# Patient Record
Sex: Female | Born: 1950 | Race: White | Hispanic: No | Marital: Married | Smoking: Never smoker
Health system: Southern US, Community
[De-identification: ages and names within clinical notes are randomized; demographics above are authoritative.]

## PROBLEM LIST (undated history)

## (undated) DIAGNOSIS — I10 Essential (primary) hypertension: Secondary | ICD-10-CM

## (undated) DIAGNOSIS — N390 Urinary tract infection, site not specified: Secondary | ICD-10-CM

## (undated) DIAGNOSIS — K5792 Diverticulitis of intestine, part unspecified, without perforation or abscess without bleeding: Secondary | ICD-10-CM

## (undated) DIAGNOSIS — H269 Unspecified cataract: Secondary | ICD-10-CM

## (undated) DIAGNOSIS — E119 Type 2 diabetes mellitus without complications: Secondary | ICD-10-CM

## (undated) DIAGNOSIS — J189 Pneumonia, unspecified organism: Secondary | ICD-10-CM

## (undated) DIAGNOSIS — E039 Hypothyroidism, unspecified: Secondary | ICD-10-CM

## (undated) DIAGNOSIS — E063 Autoimmune thyroiditis: Secondary | ICD-10-CM

## (undated) DIAGNOSIS — T7840XA Allergy, unspecified, initial encounter: Secondary | ICD-10-CM

## (undated) DIAGNOSIS — E785 Hyperlipidemia, unspecified: Secondary | ICD-10-CM

## (undated) DIAGNOSIS — N200 Calculus of kidney: Secondary | ICD-10-CM

## (undated) DIAGNOSIS — M1990 Unspecified osteoarthritis, unspecified site: Secondary | ICD-10-CM

## (undated) DIAGNOSIS — E079 Disorder of thyroid, unspecified: Secondary | ICD-10-CM

## (undated) DIAGNOSIS — F329 Major depressive disorder, single episode, unspecified: Secondary | ICD-10-CM

## (undated) DIAGNOSIS — F32A Depression, unspecified: Secondary | ICD-10-CM

## (undated) HISTORY — PX: COLOSTOMY: SHX63

## (undated) HISTORY — PX: COLOSTOMY CLOSURE: SHX1381

## (undated) HISTORY — DX: Allergy, unspecified, initial encounter: T78.40XA

## (undated) HISTORY — DX: Urinary tract infection, site not specified: N39.0

## (undated) HISTORY — PX: CARPAL TUNNEL RELEASE: SHX101

## (undated) HISTORY — PX: CHOLECYSTECTOMY OPEN: SUR202

## (undated) HISTORY — PX: COLON SURGERY: SHX602

## (undated) HISTORY — DX: Diverticulitis of intestine, part unspecified, without perforation or abscess without bleeding: K57.92

## (undated) HISTORY — DX: Type 2 diabetes mellitus without complications: E11.9

## (undated) HISTORY — PX: GYNECOLOGIC CRYOSURGERY: SHX857

## (undated) HISTORY — PX: COLONOSCOPY: SHX174

## (undated) HISTORY — PX: OTHER SURGICAL HISTORY: SHX169

## (undated) HISTORY — PX: POLYPECTOMY: SHX149

## (undated) HISTORY — PX: CYST EXCISION: SHX5701

## (undated) HISTORY — DX: Unspecified cataract: H26.9

---

## 1977-05-29 HISTORY — PX: TUBAL LIGATION: SHX77

## 2012-09-27 ENCOUNTER — Other Ambulatory Visit: Payer: Self-pay | Admitting: Family Medicine

## 2012-09-27 DIAGNOSIS — Z780 Asymptomatic menopausal state: Secondary | ICD-10-CM

## 2012-09-27 DIAGNOSIS — Z1231 Encounter for screening mammogram for malignant neoplasm of breast: Secondary | ICD-10-CM

## 2012-11-05 ENCOUNTER — Ambulatory Visit
Admission: RE | Admit: 2012-11-05 | Discharge: 2012-11-05 | Disposition: A | Discharge status: Home / Self Care | Payer: BC Managed Care – PPO | Source: Ambulatory Visit | Attending: Family Medicine | Admitting: Family Medicine

## 2012-11-05 DIAGNOSIS — Z780 Asymptomatic menopausal state: Secondary | ICD-10-CM

## 2012-11-05 DIAGNOSIS — Z1231 Encounter for screening mammogram for malignant neoplasm of breast: Secondary | ICD-10-CM

## 2013-04-10 ENCOUNTER — Emergency Department (HOSPITAL_COMMUNITY): Payer: BC Managed Care – PPO

## 2013-04-10 ENCOUNTER — Emergency Department (HOSPITAL_COMMUNITY)
Admission: EM | Admit: 2013-04-10 | Discharge: 2013-04-10 | Disposition: A | Discharge status: Home / Self Care | Payer: BC Managed Care – PPO | Attending: Emergency Medicine | Admitting: Emergency Medicine

## 2013-04-10 ENCOUNTER — Encounter (HOSPITAL_COMMUNITY): Payer: Self-pay | Admitting: Emergency Medicine

## 2013-04-10 DIAGNOSIS — Z87442 Personal history of urinary calculi: Secondary | ICD-10-CM | POA: Insufficient documentation

## 2013-04-10 DIAGNOSIS — N390 Urinary tract infection, site not specified: Secondary | ICD-10-CM

## 2013-04-10 DIAGNOSIS — E063 Autoimmune thyroiditis: Secondary | ICD-10-CM | POA: Insufficient documentation

## 2013-04-10 DIAGNOSIS — E079 Disorder of thyroid, unspecified: Secondary | ICD-10-CM | POA: Insufficient documentation

## 2013-04-10 DIAGNOSIS — Z880 Allergy status to penicillin: Secondary | ICD-10-CM | POA: Insufficient documentation

## 2013-04-10 DIAGNOSIS — I10 Essential (primary) hypertension: Secondary | ICD-10-CM | POA: Insufficient documentation

## 2013-04-10 DIAGNOSIS — Z881 Allergy status to other antibiotic agents status: Secondary | ICD-10-CM | POA: Insufficient documentation

## 2013-04-10 HISTORY — DX: Essential (primary) hypertension: I10

## 2013-04-10 HISTORY — DX: Disorder of thyroid, unspecified: E07.9

## 2013-04-10 HISTORY — DX: Autoimmune thyroiditis: E06.3

## 2013-04-10 LAB — POCT I-STAT, CHEM 8
Chloride: 101 mEq/L (ref 96–112)
Creatinine, Ser: 0.9 mg/dL (ref 0.50–1.10)
HCT: 43 % (ref 36.0–46.0)
Potassium: 3.7 mEq/L (ref 3.5–5.1)
Sodium: 139 mEq/L (ref 135–145)

## 2013-04-10 LAB — URINALYSIS, ROUTINE W REFLEX MICROSCOPIC
Glucose, UA: NEGATIVE mg/dL
Protein, ur: 100 mg/dL — AB
pH: 5.5 (ref 5.0–8.0)

## 2013-04-10 LAB — URINE MICROSCOPIC-ADD ON

## 2013-04-10 MED ORDER — CIPROFLOXACIN HCL 500 MG PO TABS: 500.0000 mg | ORAL_TABLET | Freq: Two times a day (BID) | ORAL | Status: DC

## 2013-04-10 MED ORDER — HYDROMORPHONE HCL PF 1 MG/ML IJ SOLN
1.0000 mg | Freq: Once | INTRAMUSCULAR | Status: AC
  Administered 2013-04-10: 1 mg via INTRAVENOUS
  Filled 2013-04-10: qty 1

## 2013-04-10 MED ORDER — FENTANYL CITRATE 0.05 MG/ML IJ SOLN
50.0000 ug | Freq: Once | INTRAMUSCULAR | Status: AC
  Administered 2013-04-10: 50 ug via INTRAVENOUS
  Filled 2013-04-10: qty 2

## 2013-04-10 MED ORDER — ONDANSETRON HCL 4 MG/2ML IJ SOLN
4.0000 mg | Freq: Once | INTRAMUSCULAR | Status: AC
  Administered 2013-04-10: 4 mg via INTRAVENOUS
  Filled 2013-04-10: qty 2

## 2013-04-10 MED ORDER — CIPROFLOXACIN IN D5W 400 MG/200ML IV SOLN
400.0000 mg | Freq: Once | INTRAVENOUS | Status: AC
  Administered 2013-04-10: 400 mg via INTRAVENOUS
  Filled 2013-04-10: qty 200

## 2013-04-10 MED ORDER — HYDROCODONE-ACETAMINOPHEN 5-325 MG PO TABS: 2.0000 | ORAL_TABLET | ORAL | Status: DC | PRN

## 2013-04-10 MED ORDER — PROMETHAZINE HCL 25 MG PO TABS: 25.0000 mg | ORAL_TABLET | Freq: Four times a day (QID) | ORAL | Status: DC | PRN

## 2013-04-10 NOTE — ED Provider Notes (Signed)
CSN: [id number]     Arrival date & time 04/10/13  0251 History   First MD Initiated Contact with Patient 04/10/13 [phone]     Chief Complaint  Patient presents with  . Abdominal Pain   (Consider location/radiation/quality/duration/timing/severity/associated sxs/prior Treatment) HPI History provided by patient. History of kidney stones and this morning woke at 1:30 AM with sudden onset sharp and severe right flank pain. Pain radiates to right groin. No hematuria noticed. No fevers or chills. No nausea vomiting. Feels similar to previous kidney stones. She has had some dysuria and urinary frequency with symptoms tonight. No known alleviating factors. Past Medical History  Diagnosis Date  . Renal disorder   . Hypertension   . Thyroid disease   . Hashimoto's disease    History reviewed. No pertinent past surgical history. History reviewed. No pertinent family history. History  Substance Use Topics  . Smoking status: Never Smoker   . Smokeless tobacco: Not on file  . Alcohol Use: No   OB History   Grav Para Term Preterm Abortions TAB SAB Ect Mult Living                 Review of Systems  Constitutional: Negative for fever and chills.  Eyes: Negative for pain.  Respiratory: Negative for shortness of breath.   Cardiovascular: Negative for chest pain.  Gastrointestinal: Positive for abdominal pain. Negative for vomiting.  Genitourinary: Positive for flank pain. Negative for dysuria.  Musculoskeletal: Negative for back pain, neck pain and neck stiffness.  Skin: Negative for rash.  Neurological: Negative for headaches.  All other systems reviewed and are negative.    Allergies  Erythromycin and Penicillins  Home Medications  No current outpatient prescriptions on file. BP 126/63  Pulse 73  Temp(Src) 98.5 F (36.9 C) (Oral)  Resp 18  Wt 201 lb 12.8 oz (91.536 kg)  SpO2 98% Physical Exam  Constitutional: She is oriented to person, place, and time. She appears well-developed  and well-nourished.  HENT:  Head: Normocephalic and atraumatic.  Eyes: EOM are normal. Pupils are equal, round, and reactive to light.  Neck: Neck supple.  Cardiovascular: Normal rate, regular rhythm and intact distal pulses.   Pulmonary/Chest: Effort normal and breath sounds normal. No respiratory distress. She exhibits no tenderness.  Abdominal: Soft. Bowel sounds are normal. She exhibits no distension. There is no tenderness. There is no rebound and no guarding.  No CVA tenderness. Localizes symptoms to right flank and right lower quadrant without tenderness  Musculoskeletal: Normal range of motion. She exhibits no edema.  Neurological: She is alert and oriented to person, place, and time.  Skin: Skin is warm and dry.      ED Course  Procedures (including critical care time) Labs Review Labs Reviewed  URINALYSIS, ROUTINE W REFLEX MICROSCOPIC - Abnormal; Notable for the following:    Color, Urine AMBER (*)    APPearance TURBID (*)    Hgb urine dipstick LARGE (*)    Protein, ur 100 (*)    Nitrite POSITIVE (*)    Leukocytes, UA LARGE (*)    All other components within normal limits  URINE MICROSCOPIC-ADD ON - Abnormal; Notable for the following:    Bacteria, UA MANY (*)    All other components within normal limits  URINE CULTURE   Imaging Review Ct Abdomen Pelvis Wo Contrast  04/10/2013   CLINICAL DATA:  Right flank pain radiating to the groin, dysuria.  EXAM: CT ABDOMEN AND PELVIS WITHOUT CONTRAST  TECHNIQUE: Multidetector CT imaging  of the abdomen and pelvis was performed following the standard protocol without intravenous contrast.  COMPARISON:  None available for comparison at time of study interpretation.  FINDINGS: Included view of the lung bases demonstrate minimal right middle lobe atelectasis. Minimal pericardial effusion.  Kidneys are orthotopic, demonstrating normal size and morphology without nephrolithiasis, hydronephrosis; limited assessment for renal masses on this  nonenhanced examination. The unopacified ureters are normal in course and caliber. Mild inflammatory changes about the right ureter without urolithiasis. Urinary bladder is partially distended with mild surrounding fat stranding.  The liver, spleen, pancreas and adrenal glands are unremarkable for this non-contrast examination. Gallbladder appears surgically absent.  The stomach, small and large bowel are normal in course and caliber without inflammatory changes, the sensitivity may be decreased by lack of enteric contrast. Right lower quadrant small bowel anastomotic line, with scarring along the right anterior abdominal wall which suggests takedown. Normal appendix. Sigmoid diverticulosis. No intraperitoneal free fluid nor free air.  Great vessels are normal in course and caliber. No lymphadenopathy by CT size criteria. Internal reproductive organs are unremarkable. The soft tissues and included osseous structures are nonsuspicious. Laparotomy scar.  IMPRESSION: Inflammatory changes about the right ureter and the urinary bladder (cystitis) suggesting infectious process without urolithiasis or obstructive uropathy.  Small bowel anastomotic line, no bowel obstruction.  Minimal pericardial effusion.   Electronically Signed   By: Awilda Metro   On: 04/10/2013 05:40    IV fluids. IV Dilaudid. IV Zofran. IV Cipro  UTI without fevers or vomiting. Vital signs reassuring. Plan discharge home with close outpatient followup. Urine culture pending. Reliable historian and agrees to strict return precautions. Prescription for antibiotics, antiemetics as needed and pain medications as needed.   MDM  Diagnoses: UTI, history of kidney stones  Urinalysis reviewed as above and IV antibiotics provided Improved with IV fluids and IV narcotics Vital signs nursing notes reviewed and considered    Sunnie Nielsen, MD 04/10/13 [phone]

## 2013-04-10 NOTE — ED Notes (Signed)
Pt believes that she passed a kidney stone last night, and that she is currently passing another. Pt reports a hx of kidney stones.

## 2013-04-10 NOTE — ED Notes (Signed)
Presents with right flank pain that began at 1:30 am today and moves to the right lower groin area, pain is described as sharp and pulsating. Reports frequent urination and dysuria. Denies hematuria. Pain feels same as kidney stone previously.

## 2013-04-10 NOTE — ED Notes (Signed)
Opitz, MD at bedside. 

## 2013-04-10 NOTE — ED Notes (Signed)
Care transferred, report received Willene Hatchet, RN.

## 2013-04-12 LAB — URINE CULTURE

## 2013-04-13 ENCOUNTER — Telehealth (HOSPITAL_COMMUNITY): Payer: Self-pay | Admitting: Emergency Medicine

## 2013-04-13 NOTE — ED Notes (Signed)
Post ED Visit - Positive Culture Follow-up  Culture report reviewed by antimicrobial stewardship pharmacist: []  Wes Dulaney, Pharm.D., BCPS []  Celedonio Miyamoto, Pharm.D., BCPS []  Georgina Pillion, Pharm.D., BCPS []  [place], [address].D., BCPS, AAHIVP []  Estella Husk, Pharm.D., BCPS, AAHIVP [x]  Okey Regal, Pharm.D., BCPS  Positive urine culture Treated with Cipro, organism sensitive to the same and no further patient follow-up is required at this time.  Kylie A Holland 04/13/2013, 1:27 PM

## 2013-11-30 ENCOUNTER — Encounter (HOSPITAL_COMMUNITY): Payer: Self-pay | Admitting: Emergency Medicine

## 2013-11-30 ENCOUNTER — Emergency Department (HOSPITAL_COMMUNITY)
Admission: EM | Admit: 2013-11-30 | Discharge: 2013-11-30 | Disposition: A | Discharge status: Home / Self Care | Payer: BC Managed Care – PPO | Source: Home / Self Care | Attending: Family Medicine | Admitting: Family Medicine

## 2013-11-30 DIAGNOSIS — H6091 Unspecified otitis externa, right ear: Secondary | ICD-10-CM

## 2013-11-30 DIAGNOSIS — H60399 Other infective otitis externa, unspecified ear: Secondary | ICD-10-CM

## 2013-11-30 MED ORDER — NEOMYCIN-POLYMYXIN-HC 3.5-10000-1 OT SUSP: 4.0000 [drp] | Freq: Four times a day (QID) | OTIC | Status: DC

## 2013-11-30 NOTE — ED Notes (Signed)
Pt  Reports  r  Earache   As  Well  As  Being  Dizzy      X  sev  Days

## 2013-11-30 NOTE — ED Provider Notes (Signed)
CSN: [id number]     Arrival date & time 11/30/13  1213 History   First MD Initiated Contact with Patient 11/30/13 1217     Chief Complaint  Patient presents with  . Otalgia   (Consider location/radiation/quality/duration/timing/severity/associated sxs/prior Treatment) Patient is a 63 y.o. female presenting with ear pain. The history is provided by the patient.  Otalgia Location:  Right Behind ear:  No abnormality Quality:  Sharp Severity:  Mild Onset quality:  Gradual Duration:  2 days Progression:  Worsening Chronicity:  New Context comment:  Swimming 2 weeks ago in pool. Associated symptoms: no ear discharge, no fever and no rhinorrhea     Past Medical History  Diagnosis Date  . Renal disorder   . Hypertension   . Thyroid disease   . Hashimoto's disease    History reviewed. No pertinent past surgical history. History reviewed. No pertinent family history. History  Substance Use Topics  . Smoking status: Never Smoker   . Smokeless tobacco: Not on file  . Alcohol Use: No   OB History   Grav Para Term Preterm Abortions TAB SAB Ect Mult Living                 Review of Systems  Constitutional: Negative.  Negative for fever.  HENT: Positive for ear pain. Negative for ear discharge, facial swelling, postnasal drip and rhinorrhea.     Allergies  Erythromycin and Penicillins  Home Medications   Prior to Admission medications   Medication Sig Start Date End Date Taking? Authorizing Provider  busPIRone (BUSPAR) 10 MG tablet Take 10 mg by mouth 3 (three) times daily as needed (depression).    Historical Provider, MD  Calcium Carb-Cholecalciferol (CALCIUM 600 + D PO) Take 1 tablet by mouth 2 (two) times daily.    Historical Provider, MD  cetirizine (ZYRTEC) 10 MG tablet Take 10 mg by mouth daily.    Historical Provider, MD  Cholecalciferol (VITAMIN D3) 2000 UNITS TABS Take 1 tablet by mouth daily.    Historical Provider, MD  CINNAMON PO Take 2 capsules by mouth daily.     Historical Provider, MD  ciprofloxacin (CIPRO) 500 MG tablet Take 1 tablet (500 mg total) by mouth 2 (two) times daily. 04/10/13   Teressa Lower, MD  citalopram (CELEXA) 20 MG tablet Take 20 mg by mouth daily.    Historical Provider, MD  Garlic 0347 MG CAPS Take 1 capsule by mouth daily.    Historical Provider, MD  glipiZIDE (GLUCOTROL) 10 MG tablet Take 10 mg by mouth daily before breakfast.    Historical Provider, MD  HYDROcodone-acetaminophen (NORCO/VICODIN) 5-325 MG per tablet Take 2 tablets by mouth every 4 (four) hours as needed. 04/10/13   Teressa Lower, MD  levothyroxine (SYNTHROID, LEVOTHROID) 100 MCG tablet Take 100 mcg by mouth daily before breakfast.    Historical Provider, MD  lisinopril (PRINIVIL,ZESTRIL) 20 MG tablet Take 20 mg by mouth daily.    Historical Provider, MD  lovastatin (MEVACOR) 40 MG tablet Take 40 mg by mouth at bedtime.    Historical Provider, MD  Magnesium Oxide (MAG-OX 400 PO) Take 1 tablet by mouth daily.    Historical Provider, MD  metFORMIN (GLUCOPHAGE) 1000 MG tablet Take 1,000 mg by mouth 2 (two) times daily with a meal.    Historical Provider, MD  mupirocin ointment (BACTROBAN) 2 % Place 1 application into the nose 3 (three) times daily.    Historical Provider, MD  neomycin-polymyxin-hydrocortisone (CORTISPORIN) 3.5-10000-1 otic suspension Place 4 drops into the  right ear 4 (four) times daily. 11/30/13   Billy Fischer, MD  promethazine (PHENERGAN) 25 MG tablet Take 1 tablet (25 mg total) by mouth every 6 (six) hours as needed for nausea or vomiting. 04/10/13   Teressa Lower, MD  vitamin B-12 (CYANOCOBALAMIN) 1000 MCG tablet Take 1,000 mcg by mouth daily.    Historical Provider, MD  vitamin E 400 UNIT capsule Take 400 Units by mouth daily.    Historical Provider, MD   BP 142/98  Pulse 68  Temp(Src) 98 F (36.7 C) (Oral)  Resp 16  SpO2 98% Physical Exam  Nursing note and vitals reviewed. Constitutional: She is oriented to person, place, and time. She appears  well-developed and well-nourished. No distress.  HENT:  Head: Normocephalic.  Right Ear: There is swelling and tenderness. No drainage. Tympanic membrane is not injected, not scarred, not perforated, not erythematous and not bulging.  Left Ear: Tympanic membrane, external ear and ear canal normal.  Nose: Nose normal.  Mouth/Throat: Oropharynx is clear and moist.  Neck: Normal range of motion. Neck supple.  Neurological: She is alert and oriented to person, place, and time.  Skin: Skin is warm and dry.    ED Course  Procedures (including critical care time) Labs Review Labs Reviewed - No data to display  Imaging Review No results found.   MDM   1. Otitis externa, acute, right        Billy Fischer, MD 11/30/13 [phone]

## 2014-02-17 ENCOUNTER — Other Ambulatory Visit: Payer: Self-pay | Admitting: Nurse Practitioner

## 2014-02-17 DIAGNOSIS — Z1231 Encounter for screening mammogram for malignant neoplasm of breast: Secondary | ICD-10-CM

## 2015-06-16 ENCOUNTER — Encounter (HOSPITAL_COMMUNITY): Payer: Self-pay | Admitting: *Deleted

## 2015-06-16 ENCOUNTER — Emergency Department (HOSPITAL_COMMUNITY): Payer: Medicare HMO

## 2015-06-16 ENCOUNTER — Other Ambulatory Visit: Payer: Self-pay

## 2015-06-16 DIAGNOSIS — Z8249 Family history of ischemic heart disease and other diseases of the circulatory system: Secondary | ICD-10-CM | POA: Diagnosis not present

## 2015-06-16 DIAGNOSIS — E785 Hyperlipidemia, unspecified: Secondary | ICD-10-CM | POA: Diagnosis not present

## 2015-06-16 DIAGNOSIS — Z7984 Long term (current) use of oral hypoglycemic drugs: Secondary | ICD-10-CM | POA: Insufficient documentation

## 2015-06-16 DIAGNOSIS — Z79899 Other long term (current) drug therapy: Secondary | ICD-10-CM | POA: Diagnosis not present

## 2015-06-16 DIAGNOSIS — E119 Type 2 diabetes mellitus without complications: Secondary | ICD-10-CM | POA: Insufficient documentation

## 2015-06-16 DIAGNOSIS — E063 Autoimmune thyroiditis: Secondary | ICD-10-CM | POA: Insufficient documentation

## 2015-06-16 DIAGNOSIS — E039 Hypothyroidism, unspecified: Secondary | ICD-10-CM | POA: Insufficient documentation

## 2015-06-16 DIAGNOSIS — R079 Chest pain, unspecified: Principal | ICD-10-CM | POA: Insufficient documentation

## 2015-06-16 DIAGNOSIS — I10 Essential (primary) hypertension: Secondary | ICD-10-CM | POA: Diagnosis not present

## 2015-06-16 DIAGNOSIS — F329 Major depressive disorder, single episode, unspecified: Secondary | ICD-10-CM | POA: Diagnosis not present

## 2015-06-16 LAB — CBC WITH DIFFERENTIAL/PLATELET
BASOS ABS: 0 10*3/uL (ref 0.0–0.1)
Basophils Relative: 1 %
EOS ABS: 0.4 10*3/uL (ref 0.0–0.7)
EOS PCT: 6 %
HCT: 37.1 % (ref 36.0–46.0)
Hemoglobin: 12.6 g/dL (ref 12.0–15.0)
LYMPHS PCT: 42 %
Lymphs Abs: 3 10*3/uL (ref 0.7–4.0)
MCH: 29.5 pg (ref 26.0–34.0)
MCHC: 34 g/dL (ref 30.0–36.0)
MCV: 86.9 fL (ref 78.0–100.0)
MONO ABS: 0.5 10*3/uL (ref 0.1–1.0)
Monocytes Relative: 8 %
Neutro Abs: 3.1 10*3/uL (ref 1.7–7.7)
Neutrophils Relative %: 43 %
PLATELETS: 245 10*3/uL (ref 150–400)
RBC: 4.27 MIL/uL (ref 3.87–5.11)
RDW: 13.2 % (ref 11.5–15.5)
WBC: 7.1 10*3/uL (ref 4.0–10.5)

## 2015-06-16 LAB — BASIC METABOLIC PANEL
ANION GAP: 12 (ref 5–15)
BUN: 8 mg/dL (ref 6–20)
CO2: 26 mmol/L (ref 22–32)
Calcium: 9.5 mg/dL (ref 8.9–10.3)
Chloride: 100 mmol/L — ABNORMAL LOW (ref 101–111)
Creatinine, Ser: 1.08 mg/dL — ABNORMAL HIGH (ref 0.44–1.00)
GFR calc Af Amer: >60 mL/min (ref >60)
GFR, EST NON AFRICAN AMERICAN: 53 mL/min — AB (ref >60)
Glucose, Bld: 208 mg/dL — ABNORMAL HIGH (ref 65–99)
Potassium: 3.9 mmol/L (ref 3.5–5.1)
Sodium: 138 mmol/L (ref 135–145)

## 2015-06-16 LAB — I-STAT TROPONIN, ED: TROPONIN I, POC: 0 ng/mL (ref 0.00–0.08)

## 2015-06-16 NOTE — ED Notes (Signed)
Patient presents about 9pm she started with central chest pain that traveled to her neck and to her back.  +SOB

## 2015-06-17 ENCOUNTER — Observation Stay (HOSPITAL_COMMUNITY)
Admission: EM | Admit: 2015-06-17 | Discharge: 2015-06-18 | Disposition: A | Discharge status: Home / Self Care | Payer: Medicare HMO | Attending: Internal Medicine | Admitting: Internal Medicine

## 2015-06-17 ENCOUNTER — Encounter (HOSPITAL_COMMUNITY): Payer: Self-pay | Admitting: Family Medicine

## 2015-06-17 DIAGNOSIS — I10 Essential (primary) hypertension: Secondary | ICD-10-CM | POA: Diagnosis not present

## 2015-06-17 DIAGNOSIS — E119 Type 2 diabetes mellitus without complications: Secondary | ICD-10-CM

## 2015-06-17 DIAGNOSIS — E063 Autoimmune thyroiditis: Secondary | ICD-10-CM | POA: Diagnosis not present

## 2015-06-17 DIAGNOSIS — E038 Other specified hypothyroidism: Secondary | ICD-10-CM | POA: Diagnosis present

## 2015-06-17 DIAGNOSIS — F329 Major depressive disorder, single episode, unspecified: Secondary | ICD-10-CM | POA: Diagnosis present

## 2015-06-17 DIAGNOSIS — R079 Chest pain, unspecified: Secondary | ICD-10-CM | POA: Diagnosis present

## 2015-06-17 DIAGNOSIS — R0789 Other chest pain: Secondary | ICD-10-CM

## 2015-06-17 DIAGNOSIS — F32A Depression, unspecified: Secondary | ICD-10-CM | POA: Diagnosis present

## 2015-06-17 HISTORY — DX: Calculus of kidney: N20.0

## 2015-06-17 HISTORY — DX: Hyperlipidemia, unspecified: E78.5

## 2015-06-17 HISTORY — DX: Depression, unspecified: F32.A

## 2015-06-17 HISTORY — DX: Major depressive disorder, single episode, unspecified: F32.9

## 2015-06-17 HISTORY — DX: Hypothyroidism, unspecified: E03.9

## 2015-06-17 HISTORY — DX: Pneumonia, unspecified organism: J18.9

## 2015-06-17 HISTORY — DX: Type 2 diabetes mellitus without complications: E11.9

## 2015-06-17 HISTORY — DX: Unspecified osteoarthritis, unspecified site: M19.90

## 2015-06-17 LAB — CBC
HEMATOCRIT: 35 % — AB (ref 36.0–46.0)
HEMOGLOBIN: 12 g/dL (ref 12.0–15.0)
MCH: 29.6 pg (ref 26.0–34.0)
MCHC: 34.3 g/dL (ref 30.0–36.0)
MCV: 86.2 fL (ref 78.0–100.0)
Platelets: 207 10*3/uL (ref 150–400)
RBC: 4.06 MIL/uL (ref 3.87–5.11)
RDW: 13.1 % (ref 11.5–15.5)
WBC: 7 10*3/uL (ref 4.0–10.5)

## 2015-06-17 LAB — TROPONIN I

## 2015-06-17 LAB — CREATININE, SERUM
CREATININE: 0.97 mg/dL (ref 0.44–1.00)
GFR, EST NON AFRICAN AMERICAN: 60 mL/min — AB (ref >60)

## 2015-06-17 MED ORDER — ACETAMINOPHEN 325 MG PO TABS: 650.0000 mg | ORAL_TABLET | ORAL | Status: DC | PRN

## 2015-06-17 MED ORDER — ENOXAPARIN SODIUM 40 MG/0.4ML ~~LOC~~ SOLN
40.0000 mg | SUBCUTANEOUS | Status: DC
  Administered 2015-06-17: 40 mg via SUBCUTANEOUS
  Filled 2015-06-17: qty 0.4

## 2015-06-17 MED ORDER — ASPIRIN EC 81 MG PO TBEC
81.0000 mg | DELAYED_RELEASE_TABLET | Freq: Every day | ORAL | Status: DC
  Administered 2015-06-18: 81 mg via ORAL
  Filled 2015-06-17: qty 1

## 2015-06-17 MED ORDER — METFORMIN HCL 500 MG PO TABS
1000.0000 mg | ORAL_TABLET | Freq: Two times a day (BID) | ORAL | Status: DC
  Administered 2015-06-17 – 2015-06-18 (×2): 1000 mg via ORAL
  Filled 2015-06-17 (×2): qty 2

## 2015-06-17 MED ORDER — LEVOTHYROXINE SODIUM 100 MCG PO TABS
100.0000 ug | ORAL_TABLET | Freq: Every day | ORAL | Status: DC
  Administered 2015-06-17 – 2015-06-18 (×2): 100 ug via ORAL
  Filled 2015-06-17 (×3): qty 1

## 2015-06-17 MED ORDER — CITALOPRAM HYDROBROMIDE 20 MG PO TABS
20.0000 mg | ORAL_TABLET | Freq: Every day | ORAL | Status: DC
  Administered 2015-06-17 – 2015-06-18 (×2): 20 mg via ORAL
  Filled 2015-06-17: qty 2
  Filled 2015-06-17: qty 1

## 2015-06-17 MED ORDER — ASPIRIN 81 MG PO CHEW
324.0000 mg | CHEWABLE_TABLET | Freq: Once | ORAL | Status: AC
  Administered 2015-06-17: 324 mg via ORAL
  Filled 2015-06-17: qty 4

## 2015-06-17 MED ORDER — GLIPIZIDE 10 MG PO TABS
10.0000 mg | ORAL_TABLET | Freq: Two times a day (BID) | ORAL | Status: DC
  Administered 2015-06-17 – 2015-06-18 (×2): 10 mg via ORAL
  Filled 2015-06-17 (×5): qty 1

## 2015-06-17 MED ORDER — LOSARTAN POTASSIUM 50 MG PO TABS
100.0000 mg | ORAL_TABLET | Freq: Every day | ORAL | Status: DC
  Administered 2015-06-18: 100 mg via ORAL
  Filled 2015-06-17 (×2): qty 2

## 2015-06-17 MED ORDER — CARVEDILOL 6.25 MG PO TABS
6.2500 mg | ORAL_TABLET | Freq: Two times a day (BID) | ORAL | Status: DC
  Administered 2015-06-17 – 2015-06-18 (×2): 6.25 mg via ORAL
  Filled 2015-06-17 (×2): qty 1

## 2015-06-17 MED ORDER — ONDANSETRON HCL 4 MG/2ML IJ SOLN: 4.0000 mg | Freq: Four times a day (QID) | INTRAMUSCULAR | Status: DC | PRN

## 2015-06-17 MED ORDER — HYDROCERIN EX CREA
TOPICAL_CREAM | Freq: Two times a day (BID) | CUTANEOUS | Status: DC
  Filled 2015-06-17: qty 113

## 2015-06-17 MED ORDER — LORATADINE 10 MG PO TABS
10.0000 mg | ORAL_TABLET | Freq: Every day | ORAL | Status: DC
  Administered 2015-06-18: 10 mg via ORAL
  Filled 2015-06-17: qty 1

## 2015-06-17 MED ORDER — GI COCKTAIL ~~LOC~~: 30.0000 mL | Freq: Four times a day (QID) | ORAL | Status: DC | PRN

## 2015-06-17 NOTE — ED Notes (Signed)
Dr. Danford at bedside  

## 2015-06-17 NOTE — ED Notes (Signed)
Paged Admitting about daily medications

## 2015-06-17 NOTE — Consult Note (Signed)
Patient ID: Kourtny Guzzetta MRN: [id number], DOB/AGE: 1951/02/22   Admit date: 06/17/2015   Primary Physician: Vicenta Aly, [place] Primary Cardiologist: [place] (Dr. Radford Pax)  Pt. Profile:  65 y/o female with h/o HTN, HLD and DM, presenting to the ED with complaint of chest pain.   Problem List  Past Medical History  Diagnosis Date  . Renal disorder   . Hypertension   . Thyroid disease   . Hashimoto's disease     History reviewed. No pertinent past surgical history.   Allergies  Allergies  Allergen Reactions  . Ace Inhibitors Cough  . Erythromycin Nausea And Vomiting  . Penicillins Rash    HPI  65 y/o female with no prior cardiac history presenting to the [facility] ED with a complaint of chest pain. Her cardiac risk factors include HTN, HLD and DM. She also has a h/o hypothyroidism. She reports she saw a cardiologist (Dr. Einar Gip) 2 years ago for a stress test. She assumes it was normal. She has not followed-up since. She denies any family h/o CAD. No personal history of tobacco abuse.   She presents to the ED with a complaint of chest pain. Episode started suddenly last night. Substernal with radiation up into her neck, jaw and upper teeth. Occurred at rest. Felt like an achy sensation. She had mild associated dyspnea. No n/v, diaphoresis, syncope/ near syncope. The episode lasted roughly 30 min before spontaneously resolving. She later went out to walk her dog and had recurrent symptoms with exertion. This episode lasted a bit longer but not as severe as the first. It also resolved spontaneously.   Since arriving to the ED, she denies any recurrent CP. She occasionally has brief episodes of dyspnea, that last only a few seconds. Occurs at rest. She has noticed this sensation of loosing her breath off and on for the last several weeks. She denies any prolonged periods of dyspnea. No orthopnea or PND. No pleuritic CP, leg pain, edema, or prolonged travel. No recent viral illnesses.   EKG  shows NSR with low voltage QRS complexes. Cardiac enzymes are negative x 2. She is currently asymptomatic. VSS.    Home Medications  Prior to Admission medications   Medication Sig Start Date End Date Taking? Authorizing Provider  aspirin EC 81 MG tablet Take 81 mg by mouth daily.   Yes Historical Provider, MD  Calcium Carb-Cholecalciferol (CALCIUM 600 + D PO) Take 1 tablet by mouth 2 (two) times daily.   Yes Historical Provider, MD  carvedilol (COREG) 6.25 MG tablet Take 6.25 mg by mouth 2 (two) times daily with a meal.   Yes Historical Provider, MD  cetirizine (ZYRTEC) 10 MG tablet Take 10 mg by mouth daily.   Yes Historical Provider, MD  Cholecalciferol (VITAMIN D3) 2000 UNITS TABS Take 1 tablet by mouth daily.   Yes Historical Provider, MD  CINNAMON PO Take 2 capsules by mouth daily.   Yes Historical Provider, MD  citalopram (CELEXA) 20 MG tablet Take 20 mg by mouth daily.   Yes Historical Provider, MD  Cranberry 500 MG CAPS Take 1 capsule by mouth daily.   Yes Historical Provider, MD  Garlic 123XX123 MG CAPS Take 1 capsule by mouth daily.   Yes Historical Provider, MD  glipiZIDE (GLUCOTROL) 10 MG tablet Take 10 mg by mouth 2 (two) times daily before a meal.    Yes Historical Provider, MD  levothyroxine (SYNTHROID, LEVOTHROID) 100 MCG tablet Take 100 mcg by mouth daily before breakfast.   Yes Historical  Provider, MD  losartan (COZAAR) 100 MG tablet Take 100 mg by mouth daily.   Yes Historical Provider, MD  metFORMIN (GLUCOPHAGE) 1000 MG tablet Take 1,000 mg by mouth 2 (two) times daily with a meal.   Yes Historical Provider, MD  vitamin B-12 (CYANOCOBALAMIN) 1000 MCG tablet Take 1,000 mcg by mouth daily.   Yes Historical Provider, MD  vitamin E 400 UNIT capsule Take 400 Units by mouth daily.   Yes Historical Provider, MD  HYDROcodone-acetaminophen (NORCO/VICODIN) 5-325 MG per tablet Take 2 tablets by mouth every 4 (four) hours as needed. Patient not taking: Reported on 06/17/2015 04/10/13    Teressa Lower, MD  neomycin-polymyxin-hydrocortisone (CORTISPORIN) 3.5-10000-1 otic suspension Place 4 drops into the right ear 4 (four) times daily. Patient not taking: Reported on 06/17/2015 11/30/13   Billy Fischer, MD  promethazine (PHENERGAN) 25 MG tablet Take 1 tablet (25 mg total) by mouth every 6 (six) hours as needed for nausea or vomiting. Patient not taking: Reported on 06/17/2015 04/10/13   Teressa Lower, MD    Family History  Family History  Problem Relation Age of Onset  . Heart failure Mother   . Diabetes Mother   . Diabetes Brother   . Stroke Brother     Social History  Social History   Social History  . Marital Status: Married    Spouse Name: N/A  . Number of Children: N/A  . Years of Education: N/A   Occupational History  . Not on file.   Social History Main Topics  . Smoking status: Never Smoker   . Smokeless tobacco: Never Used  . Alcohol Use: No  . Drug Use: No  . Sexual Activity: Not on file   Other Topics Concern  . Not on file   Social History Narrative     Review of Systems General:  No chills, fever, night sweats or weight changes.  Cardiovascular:  No chest pain, dyspnea on exertion, edema, orthopnea, palpitations, paroxysmal nocturnal dyspnea. Dermatological: No rash, lesions/masses Respiratory: No cough, dyspnea Urologic: No hematuria, dysuria Abdominal:   No nausea, vomiting, diarrhea, bright red blood per rectum, melena, or hematemesis Neurologic:  No visual changes, wkns, changes in mental status. All other systems reviewed and are otherwise negative except as noted above.  Physical Exam  Blood pressure 111/66, pulse 66, temperature 98.6 F (37 C), temperature source Oral, resp. rate 22, height 5\' 6"  (1.676 m), weight 199 lb (90.266 kg), SpO2 96 %.  General: Pleasant, NAD Psych: Normal affect. Neuro: Alert and oriented X 3. Moves all extremities spontaneously. HEENT: Normal  Neck: Supple without bruits or JVD. Lungs:  Resp regular  and unlabored, CTA. Heart: RRR no s3, s4, or murmurs. Abdomen: Soft, non-tender, non-distended, BS + x 4.  Extremities: No clubbing, cyanosis or edema. DP/PT/Radials 2+ and equal bilaterally.  Labs  Troponin [facility] of Care Test)  Recent Labs  06/16/15 2245  TROPIPOC 0.00    Recent Labs  06/17/15 0524 06/17/15 0826  TROPONINI <0.03 <0.03   Lab Results  Component Value Date   WBC 7.0 06/17/2015   HGB 12.0 06/17/2015   HCT 35.0* 06/17/2015   MCV 86.2 06/17/2015   PLT 207 06/17/2015    Recent Labs Lab 06/16/15 2248 06/17/15 0524  NA 138  --   K 3.9  --   CL 100*  --   CO2 26  --   BUN 8  --   CREATININE 1.08* 0.97  CALCIUM 9.5  --   GLUCOSE 208*  --  No results found for: CHOL, HDL, LDLCALC, TRIG No results found for: DDIMER   Radiology/Studies  Dg Chest 2 View  06/16/2015  CLINICAL DATA:  65 year old female with chest pain EXAM: CHEST  2 VIEW COMPARISON:  None. FINDINGS: The heart size and mediastinal contours are within normal limits. Both lungs are clear. The visualized skeletal structures are unremarkable. IMPRESSION: No active cardiopulmonary disease. Electronically Signed   By: Anner Crete M.D.   On: 06/16/2015 23:15    ECG  NSR with low voltage QRS complexes.     ASSESSMENT AND PLAN  Principal Problem:   Chest pain Active Problems:   Type 2 diabetes mellitus without complication, without long-term current use of insulin (HCC)   Essential hypertension   Hypothyroidism due to Hashimoto's thyroiditis   Depression   1. Chest Pain: given patient's multiple risk factors and recent symptoms, agree with plans to admit for overnight observation. Initial EKG is non acute and initial enzyme is negative. Cycle enzymes x 3 to r/o MI. If she rules out, can plan for NST in the am to r/o ischemia.  Also recommend checking a 2D echo to assesses for structural abnormalities as well as r/o effusion given low voltage QRS complexes on EKG + CP and dyspnea.    2. HTN: BP is well controlled on coreg and losartan.  3. DM: per IM. On Metformin and Glipizide.   4. Hypothyroidism: on levothyroxine. IM to manage.    Janee Morn, PA-C 06/17/2015, 12:31 PM

## 2015-06-17 NOTE — ED Notes (Signed)
Corene Cornea, RN at the bedside rounding on patient.

## 2015-06-17 NOTE — ED Notes (Signed)
Phlebotomy at the bedside  

## 2015-06-17 NOTE — Progress Notes (Signed)
Pt transferred to unit from ED. Placed on tele monitor. Vitals taken. No current complaints of pain. Pt oriented to room. Call bell and phone within reach. Will continue to monitor.

## 2015-06-17 NOTE — Progress Notes (Signed)
Admitted after midnight. Chart reviewed. Patient examined. MI ruled out. Await echo and cardiolyte.  Doree Barthel, MD [facility] [email or web address] password [facility]

## 2015-06-17 NOTE — ED Provider Notes (Signed)
CSN: [id number]     Arrival date & time 06/16/15  2224 History  By signing my name below, I, Anna Coleman, attest that this documentation has been prepared under the direction and in the presence of Orpah Greek, MD . Electronically Signed: Rowan Coleman, Scribe. 06/17/2015. 3:32 AM.   Chief Complaint  Patient presents with  . Chest Pain   The history is provided by the patient. No language interpreter was used.   HPI Comments:  Anna Coleman is a 65 y.o. female with Hx of HTN, HLD, and DM,who presents to the Emergency Department complaining of sudden onset pain starting in jaw and radiating to throat, and into upper chest onset at 7pm yesterday (06/16/15). Pt notes pain also radiates to back. Her pain resolved and then worsened following a short walk. She reports seeing a cardiologist 2 years ago for a stress test and was perscribed medication. Pt takes a baby asprin every morning. She notes mother had DM, enlarged heart, and HTN, but no MI. She denies any chest pain currently. No alleviating factors noted.  Past Medical History  Diagnosis Date  . Renal disorder   . Hypertension   . Thyroid disease   . Hashimoto's disease    History reviewed. No pertinent past surgical history. No family history on file. Social History  Substance Use Topics  . Smoking status: Never Smoker   . Smokeless tobacco: Never Used  . Alcohol Use: No   OB History    No data available     Review of Systems  Constitutional: Negative for diaphoresis.  Cardiovascular: Positive for chest pain.  Musculoskeletal: Positive for back pain and neck pain.       +Jaw Pain  All other systems reviewed and are negative.   Allergies  Erythromycin and Penicillins  Home Medications   Prior to Admission medications   Medication Sig Start Date End Date Taking? Authorizing Provider  busPIRone (BUSPAR) 10 MG tablet Take 10 mg by mouth 3 (three) times daily as needed (depression).    Historical Provider,  MD  Calcium Carb-Cholecalciferol (CALCIUM 600 + D PO) Take 1 tablet by mouth 2 (two) times daily.    Historical Provider, MD  cetirizine (ZYRTEC) 10 MG tablet Take 10 mg by mouth daily.    Historical Provider, MD  Cholecalciferol (VITAMIN D3) 2000 UNITS TABS Take 1 tablet by mouth daily.    Historical Provider, MD  CINNAMON PO Take 2 capsules by mouth daily.    Historical Provider, MD  ciprofloxacin (CIPRO) 500 MG tablet Take 1 tablet (500 mg total) by mouth 2 (two) times daily. 04/10/13   Teressa Lower, MD  citalopram (CELEXA) 20 MG tablet Take 20 mg by mouth daily.    Historical Provider, MD  Garlic 123XX123 MG CAPS Take 1 capsule by mouth daily.    Historical Provider, MD  glipiZIDE (GLUCOTROL) 10 MG tablet Take 10 mg by mouth daily before breakfast.    Historical Provider, MD  HYDROcodone-acetaminophen (NORCO/VICODIN) 5-325 MG per tablet Take 2 tablets by mouth every 4 (four) hours as needed. 04/10/13   Teressa Lower, MD  levothyroxine (SYNTHROID, LEVOTHROID) 100 MCG tablet Take 100 mcg by mouth daily before breakfast.    Historical Provider, MD  lisinopril (PRINIVIL,ZESTRIL) 20 MG tablet Take 20 mg by mouth daily.    Historical Provider, MD  lovastatin (MEVACOR) 40 MG tablet Take 40 mg by mouth at bedtime.    Historical Provider, MD  Magnesium Oxide (MAG-OX 400 PO) Take 1 tablet by mouth  daily.    Historical Provider, MD  metFORMIN (GLUCOPHAGE) 1000 MG tablet Take 1,000 mg by mouth 2 (two) times daily with a meal.    Historical Provider, MD  mupirocin ointment (BACTROBAN) 2 % Place 1 application into the nose 3 (three) times daily.    Historical Provider, MD  neomycin-polymyxin-hydrocortisone (CORTISPORIN) 3.5-10000-1 otic suspension Place 4 drops into the right ear 4 (four) times daily. 11/30/13   Billy Fischer, MD  promethazine (PHENERGAN) 25 MG tablet Take 1 tablet (25 mg total) by mouth every 6 (six) hours as needed for nausea or vomiting. 04/10/13   Teressa Lower, MD  vitamin B-12 (CYANOCOBALAMIN)  1000 MCG tablet Take 1,000 mcg by mouth daily.    Historical Provider, MD  vitamin E 400 UNIT capsule Take 400 Units by mouth daily.    Historical Provider, MD   BP 137/86 mmHg  Pulse 67  Temp(Src) 98.6 F (37 C) (Oral)  Resp 16  Ht 5\' 6"  (1.676 m)  Wt 199 lb (90.266 kg)  BMI 32.13 kg/m2  SpO2 99% Physical Exam  Constitutional: She is oriented to person, place, and time. She appears well-developed and well-nourished. No distress.  HENT:  Head: Normocephalic and atraumatic.  Right Ear: Hearing normal.  Left Ear: Hearing normal.  Nose: Nose normal.  Mouth/Throat: Oropharynx is clear and moist and mucous membranes are normal.  Eyes: Conjunctivae and EOM are normal. Pupils are equal, round, and reactive to light.  Neck: Normal range of motion. Neck supple.  Cardiovascular: Regular rhythm, S1 normal and S2 normal.  Exam reveals no gallop and no friction rub.   No murmur heard. Pulmonary/Chest: Effort normal and breath sounds normal. No respiratory distress. She exhibits no tenderness.  Abdominal: Soft. Normal appearance and bowel sounds are normal. There is no hepatosplenomegaly. There is no tenderness. There is no rebound, no guarding, no tenderness at McBurney's point and negative Murphy's sign. No hernia.  Musculoskeletal: Normal range of motion.  Neurological: She is alert and oriented to person, place, and time. She has normal strength. No cranial nerve deficit or sensory deficit. Coordination normal. GCS eye subscore is 4. GCS verbal subscore is 5. GCS motor subscore is 6.  Skin: Skin is warm, dry and intact. No rash noted. No cyanosis.  Psychiatric: She has a normal mood and affect. Her speech is normal and behavior is normal. Thought content normal.  Nursing note and vitals reviewed.  ED Course  Procedures   DIAGNOSTIC STUDIES:  Oxygen Saturation is 97% on RA, normal by my interpretation.    COORDINATION OF CARE:  2:19 AM Pt updated with results. Will admit for observation  and consult with cardiologist for further testing. Discussed treatment plan with pt at bedside and pt agreed to plan.   Labs Review Labs Reviewed  BASIC METABOLIC PANEL - Abnormal; Notable for the following:    Chloride 100 (*)    Glucose, Bld 208 (*)    Creatinine, Ser 1.08 (*)    GFR calc non Af Amer 53 (*)    All other components within normal limits  CBC WITH DIFFERENTIAL/PLATELET  Randolm Idol, ED    Imaging Review Dg Chest 2 View  06/16/2015  CLINICAL DATA:  65 year old female with chest pain EXAM: CHEST  2 VIEW COMPARISON:  None. FINDINGS: The heart size and mediastinal contours are within normal limits. Both lungs are clear. The visualized skeletal structures are unremarkable. IMPRESSION: No active cardiopulmonary disease. Electronically Signed   By: Anner Crete M.D.   On:  06/16/2015 23:15   I have personally reviewed and evaluated these images and lab results as part of my medical decision-making.   EKG Interpretation   Date/Time:  Thursday June 17 2015 02:20:36 EST Ventricular Rate:  57 PR Interval:  181 QRS Duration: 101 QT Interval:  467 QTC Calculation: 455 R Axis:   -26 Text Interpretation:  Sinus rhythm Borderline left axis deviation Low  voltage, precordial leads Consider anterior infarct No previous tracing  Confirmed by POLLINA  MD, CHRISTOPHER [id number]) on 06/17/2015 2:57:06 AM      MDM   Final diagnoses:  Chest pain, unspecified chest pain type    Since to the [facility] was complaints of chest pain. Patient developed pain in the upper and central chest that radiated to the neck, jaw and back earlier tonight. She did have some shortness of breath associated with the symptoms. Symptoms spontaneously resolved and then recurred while walking the dog. At time of evaluation, symptoms have resolved again. Patient has multiple cardiac risk factors including age, hypertension, hypercholesterolemia, diabetes, family history, obesity. She does not have  any known coronary artery disease. She will require hospitalization for further evaluation.  I personally performed the services described in this documentation, which was scribed in my presence. The recorded information has been reviewed and is accurate.    Orpah Greek, MD 06/17/15 [phone]

## 2015-06-17 NOTE — ED Notes (Signed)
Lunch tray ordered for patient.

## 2015-06-17 NOTE — H&P (Signed)
History and Physical  Patient Name: Anna Coleman     [id number]    DOB: Feb 09, 1951    DOA: 06/17/2015 Referring physician: Malachy Moan, MD PCP: Vicenta Aly, FNP      Chief Complaint: Chest pain  HPI: Anna Coleman is a 65 y.o. female with a past medical history significant for NIDDM, HTN, and hypothyroidism who presents with chest pain.  The pain started about three hours ago while the patient was holding her grandson.  It was located in the central chest, described as a "tightness" and "choking" radiating to the neck.  It was moderate in intensity and associated with shortness of breath ("like an elephant on my chest") and subsided by itself after about 30 minutes.  Not long after, she was taking the dog out for a walk, walked down about two houses before the chest pressure returned, so she came back home and asked to go to the ER.  In the ED, the patient's initial ECG showed sinus rhythm without ST changes and troponin was negative.  TRH was asked to admit for observation, serial troponins and risk stratification.     Review of Systems:  Patient seen 4:07 AM on 06/17/2015. Pt complains of chest tightness, SOB. Pt denies any palpitations, nausea, emesis, heartburn, diaphoresis.  All other systems negative except as just noted or noted in the history of present illness.  Allergies  Allergen Reactions  . Ace Inhibitors Cough  . Erythromycin Nausea And Vomiting  . Penicillins Rash    Prior to Admission medications   Medication Sig Start Date End Date Taking? Authorizing Provider  busPIRone (BUSPAR) 10 MG tablet Take 10 mg by mouth 3 (three) times daily as needed (depression).    Historical Provider, MD  Calcium Carb-Cholecalciferol (CALCIUM 600 + D PO) Take 1 tablet by mouth 2 (two) times daily.    Historical Provider, MD  cetirizine (ZYRTEC) 10 MG tablet Take 10 mg by mouth daily.    Historical Provider, MD  Cholecalciferol (VITAMIN D3) 2000 UNITS TABS Take 1 tablet by  mouth daily.    Historical Provider, MD  CINNAMON PO Take 2 capsules by mouth daily.    Historical Provider, MD  ciprofloxacin (CIPRO) 500 MG tablet Take 1 tablet (500 mg total) by mouth 2 (two) times daily. 04/10/13   Teressa Lower, MD  citalopram (CELEXA) 20 MG tablet Take 20 mg by mouth daily.    Historical Provider, MD  Garlic 123XX123 MG CAPS Take 1 capsule by mouth daily.    Historical Provider, MD  glipiZIDE (GLUCOTROL) 10 MG tablet Take 10 mg by mouth daily before breakfast.    Historical Provider, MD  HYDROcodone-acetaminophen (NORCO/VICODIN) 5-325 MG per tablet Take 2 tablets by mouth every 4 (four) hours as needed. 04/10/13   Teressa Lower, MD  levothyroxine (SYNTHROID, LEVOTHROID) 100 MCG tablet Take 100 mcg by mouth daily before breakfast.    Historical Provider, MD  lisinopril (PRINIVIL,ZESTRIL) 20 MG tablet Take 20 mg by mouth daily.    Historical Provider, MD  lovastatin (MEVACOR) 40 MG tablet Take 40 mg by mouth at bedtime.    Historical Provider, MD  Magnesium Oxide (MAG-OX 400 PO) Take 1 tablet by mouth daily.    Historical Provider, MD  metFORMIN (GLUCOPHAGE) 1000 MG tablet Take 1,000 mg by mouth 2 (two) times daily with a meal.    Historical Provider, MD  mupirocin ointment (BACTROBAN) 2 % Place 1 application into the nose 3 (three) times daily.    Historical Provider, MD  neomycin-polymyxin-hydrocortisone (  CORTISPORIN) 3.5-10000-1 otic suspension Place 4 drops into the right ear 4 (four) times daily. 11/30/13   Billy Fischer, MD  promethazine (PHENERGAN) 25 MG tablet Take 1 tablet (25 mg total) by mouth every 6 (six) hours as needed for nausea or vomiting. 04/10/13   Teressa Lower, MD  vitamin B-12 (CYANOCOBALAMIN) 1000 MCG tablet Take 1,000 mcg by mouth daily.    Historical Provider, MD  vitamin E 400 UNIT capsule Take 400 Units by mouth daily.    Historical Provider, MD    Past Medical History  Diagnosis Date  . Renal disorder   . Hypertension   . Thyroid disease   . Hashimoto's  disease     History reviewed. No pertinent past surgical history.  Family history: family history includes Diabetes in her brother and mother; Heart failure in her mother; Stroke in her brother.  Social History: Patient lives with her husband.  She is a non-smoker.  She is independnet with all ADLs and IADLs and does not use a walker.       Physical Exam: BP 137/86 mmHg  Pulse 67  Temp(Src) 98.6 F (37 C) (Oral)  Resp 16  Ht 5\' 6"  (1.676 m)  Wt 90.266 kg (199 lb)  BMI 32.13 kg/m2  SpO2 99% General appearance: Well-developed, adult female, alert and in no acute distress.   Eyes: Anicteric, conjunctiva pink, lids and lashes normal.     ENT: No nasal deformity, discharge, or epistaxis.  OP moist without lesions.   Skin: Warm and dry.   Cardiac: RRR, nl S1-S2, no murmurs appreciated.  Capillary refill is brisk.  JVP normal.  No LE edema.  Radial and DP pulses 2+ and symmetric.   Respiratory: Normal respiratory rate and rhythm.  CTAB without rales or wheezes. Abdomen: Abdomen soft without rigidity.  No TTP. No ascites, distension.   MSK: No deformities or effusions.  No pain to precordial palpation. Neuro: Sensorium intact and responding to questions, attention normal.  Speech is fluent.  Moves all extremities equally and with normal coordination.    Psych: Behavior appropriate.  Affect normal.  No evidence of aural or visual hallucinations or delusions.       Labs on Admission:  The metabolic panel shows normal electrolytes and renal function. The complete blood count shows no leukocytosis, anemia or thrombocytopenia. The initial troponin is normal.  Radiological Exams on Admission: Personally reviewed: Dg Chest 2 View 06/16/2015   No airspace disease.   EKG: Independently reviewed. Sinus, rate 57.  Low voltage.  No ST TW changes.  QTc 465.    Assessment/Plan 1. Chest pain: This is new.  The patient has HEART score of 5. We have been asked to admit the patient for  observation and etiology consultation with Cardiology tomorrow.  -Serial troponins are ordered -Telemetry -Consult to cardiology, appreciate recommendations  2. HTN:  Slightly hypertensive at admission -Continue home carvedilol, aspirin, losartan, statin  3. NIDDM:  -Continue home metformin and glipizide  4. Depression:  -Continue home citalopram  5. Hypothyroidism:  -Continue home levothyroxine      DVT PPx: Outpatient status Diet: NPO after 4am, may advance diet if no stress testing Consultants: Cardiology Code Status: Full Family Communication: None  Disposition Plan:  Observe for arrhythmia on telemetry, serial troponins and subsequent risk stratification by Cardiology.  If testing negative, home after.  At the point of initial evaluation, it is my clinical opinion that admission for OBSERVATION is reasonable and necessary because the patient's presenting symptoms,  physical exam findings, and initial radiographic and laboratory data in the context of chronic comorbidities is felt to place him/her at high risk for further clinical deterioration but it is anticipated that the patient may be medically stable for discharge from the hospital within 24 to 48 hours.      Edwin Dada [facility] Pager [phone]

## 2015-06-17 NOTE — ED Notes (Signed)
Crackers and drink given per Dr Loleta Books

## 2015-06-18 ENCOUNTER — Encounter (HOSPITAL_COMMUNITY): Payer: Medicare HMO

## 2015-06-18 ENCOUNTER — Observation Stay (HOSPITAL_BASED_OUTPATIENT_CLINIC_OR_DEPARTMENT_OTHER): Payer: Medicare HMO

## 2015-06-18 ENCOUNTER — Ambulatory Visit (HOSPITAL_BASED_OUTPATIENT_CLINIC_OR_DEPARTMENT_OTHER): Payer: Medicare HMO

## 2015-06-18 DIAGNOSIS — R079 Chest pain, unspecified: Secondary | ICD-10-CM

## 2015-06-18 DIAGNOSIS — E119 Type 2 diabetes mellitus without complications: Secondary | ICD-10-CM | POA: Diagnosis not present

## 2015-06-18 DIAGNOSIS — E063 Autoimmune thyroiditis: Secondary | ICD-10-CM

## 2015-06-18 DIAGNOSIS — F329 Major depressive disorder, single episode, unspecified: Secondary | ICD-10-CM | POA: Diagnosis not present

## 2015-06-18 DIAGNOSIS — I10 Essential (primary) hypertension: Secondary | ICD-10-CM | POA: Diagnosis not present

## 2015-06-18 LAB — NM MYOCAR MULTI W/SPECT W/WALL MOTION / EF
CHL CUP RESTING HR STRESS: 63 {beats}/min
CSEPED: 5 min
CSEPEW: 1 METS
CSEPPHR: 89 {beats}/min
LV dias vol: 57 mL
LVSYSVOL: 15 mL
MPHR: 155 {beats}/min
NUC STRESS TID: 1.3
Percent HR: 57 %
RATE: 0.34
SDS: 4
SRS: 7
SSS: 11

## 2015-06-18 LAB — LIPID PANEL
CHOL/HDL RATIO: 4.5 ratio
CHOLESTEROL: 168 mg/dL (ref 0–200)
HDL: 37 mg/dL — ABNORMAL LOW (ref >40)
LDL CALC: 97 mg/dL (ref 0–99)
Triglycerides: 168 mg/dL — ABNORMAL HIGH (ref <150)
VLDL: 34 mg/dL (ref 0–40)

## 2015-06-18 MED ORDER — TECHNETIUM TC 99M SESTAMIBI GENERIC - CARDIOLITE
30.0000 | Freq: Once | INTRAVENOUS | Status: AC | PRN
  Administered 2015-06-18: 30 via INTRAVENOUS

## 2015-06-18 MED ORDER — REGADENOSON 0.4 MG/5ML IV SOLN
INTRAVENOUS | Status: AC
  Administered 2015-06-18: 0.4 mg via INTRAVENOUS
  Filled 2015-06-18: qty 5

## 2015-06-18 MED ORDER — PERFLUTREN LIPID MICROSPHERE
1.0000 mL | INTRAVENOUS | Status: AC | PRN
  Administered 2015-06-18: 4 mL via INTRAVENOUS
  Filled 2015-06-18: qty 10

## 2015-06-18 MED ORDER — REGADENOSON 0.4 MG/5ML IV SOLN
0.4000 mg | Freq: Once | INTRAVENOUS | Status: AC
  Administered 2015-06-18: 0.4 mg via INTRAVENOUS

## 2015-06-18 MED ORDER — TECHNETIUM TC 99M SESTAMIBI GENERIC - CARDIOLITE
10.0000 | Freq: Once | INTRAVENOUS | Status: AC | PRN
  Administered 2015-06-18: 10 via INTRAVENOUS

## 2015-06-18 NOTE — Progress Notes (Signed)
Discharged to home with family office visits in place teaching done  

## 2015-06-18 NOTE — Discharge Summary (Signed)
Physician Discharge Summary  Anna Coleman [id number] DOB: 1950-07-18 DOA: 06/17/2015  PCP: Vicenta Aly, FNP  Admit date: 06/17/2015 Discharge date: 06/18/2015  Time spent: 40 minutes  Recommendations for Outpatient Follow-up:  1. Follow-up with primary care physician in one week.  Discharge Diagnoses:  Principal Problem:   Chest pain Active Problems:   Type 2 diabetes mellitus without complication, without long-term current use of insulin ([place])   Essential hypertension   Hypothyroidism due to Hashimoto's thyroiditis   Depression   Discharge Condition: Stable  Diet recommendation: Heart healthy  Filed Weights   06/16/15 2233  Weight: 90.266 kg (199 lb)    History of present illness:  Anna Coleman is a 65 y.o. female with a past medical history significant for NIDDM, HTN, and hypothyroidism who presents with chest pain.  The pain started about three hours ago while the patient was holding her grandson. It was located in the central chest, described as a "tightness" and "choking" radiating to the neck. It was moderate in intensity and associated with shortness of breath ("like an elephant on my chest") and subsided by itself after about 30 minutes. Not long after, she was taking the dog out for a walk, walked down about two houses before the chest pressure returned, so she came back home and asked to go to the ER.  In the ED, the patient's initial ECG showed sinus rhythm without ST changes and troponin was negative. TRH was asked to admit for observation, serial troponins and risk stratification.  Hospital Course:   Chest pain: -This is new. The patient has HEART score of 5.  -Cardiology consulted, LDL is 97. -3 sets of cardiac enzymes being negative and EKG negative as well for cardiac ischemia. -No events on telemetry. -Myoview stress test done, showed no abnormalities, patient discharged to follow-up with PCP. -Her chest pain is likely musculoskeletal in  nature.  HTN:  -Slightly hypertensive at admission -Continue home carvedilol, aspirin, losartan, statin  NIDDM:  -Continue home metformin and glipizide  Depression:  -Continue home citalopram  Hypothyroidism:  -Continue home levothyroxine  Procedures:  Nuclear Myoview stress test Study Result      There was no ST segment deviation noted during stress.  Defect 1: There is a small defect of mild severity present in the mid anteroseptal and mid inferoseptal location.  The study is normal.  This is a low risk study.     Consultations: Cardiology Discharge Exam: Filed Vitals:   06/18/15 0906 06/18/15 0908  BP: 145/68 131/71  Pulse:    Temp:    Resp:     General: Alert and awake, oriented x3, not in any acute distress. HEENT: anicteric sclera, pupils reactive to light and accommodation, EOMI CVS: S1-S2 clear, no murmur rubs or gallops Chest: clear to auscultation bilaterally, no wheezing, rales or rhonchi Abdomen: soft nontender, nondistended, normal bowel sounds, no organomegaly Extremities: no cyanosis, clubbing or edema noted bilaterally Neuro: Cranial nerves II-XII intact, no focal neurological deficits  Discharge Instructions   Discharge Instructions    Diet - low sodium heart healthy    Complete by:  As directed      Increase activity slowly    Complete by:  As directed           Current Discharge Medication List    CONTINUE these medications which have NOT CHANGED   Details  aspirin EC 81 MG tablet Take 81 mg by mouth daily.    Calcium Carb-Cholecalciferol (CALCIUM 600 + D PO) Take 1  tablet by mouth 2 (two) times daily.    carvedilol (COREG) 6.25 MG tablet Take 6.25 mg by mouth 2 (two) times daily with a meal.    cetirizine (ZYRTEC) 10 MG tablet Take 10 mg by mouth daily.    Cholecalciferol (VITAMIN D3) 2000 UNITS TABS Take 1 tablet by mouth daily.    CINNAMON PO Take 2 capsules by mouth daily.    citalopram (CELEXA) 20 MG tablet Take  20 mg by mouth daily.    Cranberry 500 MG CAPS Take 1 capsule by mouth daily.    Garlic 123XX123 MG CAPS Take 1 capsule by mouth daily.    glipiZIDE (GLUCOTROL) 10 MG tablet Take 10 mg by mouth 2 (two) times daily before a meal.     levothyroxine (SYNTHROID, LEVOTHROID) 100 MCG tablet Take 100 mcg by mouth daily before breakfast.    losartan (COZAAR) 100 MG tablet Take 100 mg by mouth daily.    metFORMIN (GLUCOPHAGE) 1000 MG tablet Take 1,000 mg by mouth 2 (two) times daily with a meal.    vitamin B-12 (CYANOCOBALAMIN) 1000 MCG tablet Take 1,000 mcg by mouth daily.    vitamin E 400 UNIT capsule Take 400 Units by mouth daily.    HYDROcodone-acetaminophen (NORCO/VICODIN) 5-325 MG per tablet Take 2 tablets by mouth every 4 (four) hours as needed. Qty: 6 tablet, Refills: 0    neomycin-polymyxin-hydrocortisone (CORTISPORIN) 3.5-10000-1 otic suspension Place 4 drops into the right ear 4 (four) times daily. Qty: 10 mL, Refills: 1    promethazine (PHENERGAN) 25 MG tablet Take 1 tablet (25 mg total) by mouth every 6 (six) hours as needed for nausea or vomiting. Qty: 30 tablet, Refills: 0       Allergies  Allergen Reactions  . Ace Inhibitors Cough  . Erythromycin Nausea And Vomiting  . Penicillins Rash   Follow-up Information    Follow up with ANDERSON,TERESA, FNP In 1 week.   Specialty:  Nurse Practitioner   Contact information:   [address] [place] [address] [phone]        The results of significant diagnostics from this hospitalization (including imaging, microbiology, ancillary and laboratory) are listed below for reference.    Significant Diagnostic Studies: Dg Chest 2 View  06/16/2015  CLINICAL DATA:  65 year old female with chest pain EXAM: CHEST  2 VIEW COMPARISON:  None. FINDINGS: The heart size and mediastinal contours are within normal limits. Both lungs are clear. The visualized skeletal structures are unremarkable. IMPRESSION: No active  cardiopulmonary disease. Electronically Signed   By: Anner Crete M.D.   On: 06/16/2015 23:15   Nm Myocar Multi W/spect W/wall Motion / Ef  06/18/2015   There was no ST segment deviation noted during stress.  Defect 1: There is a small defect of mild severity present in the mid anteroseptal and mid inferoseptal location.  The study is normal.  This is a low risk study.  Low risk stress nuclear study with a mild mid septal fixed perfusion defect (most likely artifact) and normal left ventricular regional and global systolic function.    Microbiology: No results found for this or any previous visit (from the past 240 hour(s)).   Labs: Basic Metabolic Panel:  Recent Labs Lab 06/16/15 2248 06/17/15 0524  NA 138  --   K 3.9  --   CL 100*  --   CO2 26  --   GLUCOSE 208*  --   BUN 8  --   CREATININE 1.08* 0.97  CALCIUM 9.5  --    Liver Function Tests: No results for input(s): AST, ALT, ALKPHOS, BILITOT, PROT, ALBUMIN in the last 168 hours. No results for input(s): LIPASE, AMYLASE in the last 168 hours. No results for input(s): AMMONIA in the last 168 hours. CBC:  Recent Labs Lab 06/16/15 2248 06/17/15 0524  WBC 7.1 7.0  NEUTROABS 3.1  --   HGB 12.6 12.0  HCT 37.1 35.0*  MCV 86.9 86.2  PLT 245 207   Cardiac Enzymes:  Recent Labs Lab 06/17/15 0524 06/17/15 0826 06/17/15 1633  TROPONINI <0.03 <0.03 <0.03   BNP: BNP (last 3 results) No results for input(s): BNP in the last 8760 hours.  ProBNP (last 3 results) No results for input(s): PROBNP in the last 8760 hours.  CBG: No results for input(s): GLUCAP in the last 168 hours.     Signed:  Birdie Hopes MD.  [facility] 06/18/2015, 3:28 PM

## 2015-06-18 NOTE — Care Management Obs Status (Signed)
[place] NOTIFICATION   Patient Details  Name: Larina Vandenburgh MRN: [id number] Date of Birth: Mar 12, 1951   Medicare Observation Status Notification Given:  Yes    Maryclare Labrador, RN 06/18/2015, 2:17 PM

## 2015-06-18 NOTE — Progress Notes (Signed)
SUBJECTIVE:  No futher CP NSR  OBJECTIVE:   Vitals:   Filed Vitals:   06/17/15 1515 06/17/15 1538 06/17/15 1957 06/18/15 0406  BP: 125/68 145/75 114/57 119/53  Pulse: 72 67 67 73  Temp:  98.2 F (36.8 C) 98.7 F (37.1 C) 98.3 F (36.8 C)  TempSrc:  Oral Oral Oral  Resp: 17 18 18 16   Height:      Weight:      SpO2: 97% 97% 96% 96%   I&O's:  No intake or output data in the 24 hours ending 06/18/15 0812 TELEMETRY: Reviewed telemetry pt in :     PHYSICAL EXAM General: Well developed, well nourished, in no acute distress Head: Eyes PERRLA, No xanthomas.   Normal cephalic and atramatic  Lungs:   Clear bilaterally to auscultation and percussion. Heart:   HRRR S1 S2 Pulses are 2+ & equal. Abdomen: Bowel sounds are positive, abdomen soft and non-tender without masses  Extremities:   No clubbing, cyanosis or edema.  DP +1 Neuro: Alert and oriented X 3. Psych:  Good affect, responds appropriately   LABS: Basic Metabolic Panel:  Recent Labs  06/16/15 2248 06/17/15 0524  NA 138  --   K 3.9  --   CL 100*  --   CO2 26  --   GLUCOSE 208*  --   BUN 8  --   CREATININE 1.08* 0.97  CALCIUM 9.5  --    Liver Function Tests: No results for input(s): AST, ALT, ALKPHOS, BILITOT, PROT, ALBUMIN in the last 72 hours. No results for input(s): LIPASE, AMYLASE in the last 72 hours. CBC:  Recent Labs  06/16/15 2248 06/17/15 0524  WBC 7.1 7.0  NEUTROABS 3.1  --   HGB 12.6 12.0  HCT 37.1 35.0*  MCV 86.9 86.2  PLT 245 207   Cardiac Enzymes:  Recent Labs  06/17/15 0524 06/17/15 0826 06/17/15 1633  TROPONINI <0.03 <0.03 <0.03   BNP: Invalid input(s): POCBNP D-Dimer: No results for input(s): DDIMER in the last 72 hours. Hemoglobin A1C: No results for input(s): HGBA1C in the last 72 hours. Fasting Lipid Panel:  Recent Labs  06/18/15 0240  CHOL 168  HDL 37*  LDLCALC 97  TRIG 168*  CHOLHDL 4.5   Thyroid Function Tests: No results for input(s): TSH, T4TOTAL,  T3FREE, THYROIDAB in the last 72 hours.  Invalid input(s): FREET3 Anemia Panel: No results for input(s): VITAMINB12, FOLATE, FERRITIN, TIBC, IRON, RETICCTPCT in the last 72 hours. Coag Panel:   No results found for: INR, PROTIME  RADIOLOGY: Dg Chest 2 View  06/16/2015  CLINICAL DATA:  65 year old female with chest pain EXAM: CHEST  2 VIEW COMPARISON:  None. FINDINGS: The heart size and mediastinal contours are within normal limits. Both lungs are clear. The visualized skeletal structures are unremarkable. IMPRESSION: No active cardiopulmonary disease. Electronically Signed   By: Anner Crete M.D.   On: 06/16/2015 23:15    ASSESSMENT AND PLAN  Principal Problem:  Chest pain Active Problems:  Type 2 diabetes mellitus without complication, without long-term current use of insulin (HCC)  Essential hypertension  Hypothyroidism due to Hashimoto's thyroiditis  Depression   1. Chest Pain: Initial EKG is non acute and initial enzyme is negative. Cardiac enzymes are negative. Pan for NST in today to r/o ischemia. 2D echo pending to assesses for structural abnormalities as well as r/o effusion given low voltage QRS complexes on EKG + CP and dyspnea.   2. HTN: BP is well controlled on coreg  and losartan.  3. DM: per IM. On Metformin and Glipizide.   4. Hypothyroidism: on levothyroxine. IM to manage.     Sueanne Margarita, MD  06/18/2015  8:12 AM

## 2015-06-18 NOTE — Progress Notes (Signed)
Echocardiogram 2D Echocardiogram has been performed.  Anna Coleman 06/18/2015, 2:33 PM

## 2015-08-13 ENCOUNTER — Other Ambulatory Visit: Payer: Self-pay | Admitting: Nurse Practitioner

## 2015-08-13 DIAGNOSIS — Z1231 Encounter for screening mammogram for malignant neoplasm of breast: Secondary | ICD-10-CM

## 2015-08-26 ENCOUNTER — Ambulatory Visit: Payer: Medicare HMO

## 2015-11-23 ENCOUNTER — Ambulatory Visit
Admission: RE | Admit: 2015-11-23 | Discharge: 2015-11-23 | Disposition: A | Discharge status: Home / Self Care | Payer: Medicare HMO | Source: Ambulatory Visit | Attending: Nurse Practitioner | Admitting: Nurse Practitioner

## 2015-11-23 DIAGNOSIS — Z1231 Encounter for screening mammogram for malignant neoplasm of breast: Secondary | ICD-10-CM

## 2015-11-26 ENCOUNTER — Other Ambulatory Visit: Payer: Self-pay | Admitting: Nurse Practitioner

## 2015-11-26 DIAGNOSIS — E2839 Other primary ovarian failure: Secondary | ICD-10-CM

## 2015-12-16 ENCOUNTER — Other Ambulatory Visit: Payer: Medicare HMO

## 2016-02-27 ENCOUNTER — Emergency Department (HOSPITAL_COMMUNITY): Payer: Medicare HMO

## 2016-02-27 ENCOUNTER — Encounter (HOSPITAL_COMMUNITY): Payer: Self-pay | Admitting: Emergency Medicine

## 2016-02-27 ENCOUNTER — Emergency Department (HOSPITAL_COMMUNITY)
Admission: EM | Admit: 2016-02-27 | Discharge: 2016-02-27 | Disposition: A | Discharge status: Home / Self Care | Payer: Medicare HMO | Attending: Emergency Medicine | Admitting: Emergency Medicine

## 2016-02-27 DIAGNOSIS — W19XXXA Unspecified fall, initial encounter: Secondary | ICD-10-CM

## 2016-02-27 DIAGNOSIS — Z7984 Long term (current) use of oral hypoglycemic drugs: Secondary | ICD-10-CM | POA: Diagnosis not present

## 2016-02-27 DIAGNOSIS — S0990XA Unspecified injury of head, initial encounter: Secondary | ICD-10-CM | POA: Diagnosis not present

## 2016-02-27 DIAGNOSIS — E039 Hypothyroidism, unspecified: Secondary | ICD-10-CM | POA: Insufficient documentation

## 2016-02-27 DIAGNOSIS — E119 Type 2 diabetes mellitus without complications: Secondary | ICD-10-CM | POA: Diagnosis not present

## 2016-02-27 DIAGNOSIS — Z7982 Long term (current) use of aspirin: Secondary | ICD-10-CM | POA: Diagnosis not present

## 2016-02-27 DIAGNOSIS — I10 Essential (primary) hypertension: Secondary | ICD-10-CM | POA: Insufficient documentation

## 2016-02-27 DIAGNOSIS — Z79899 Other long term (current) drug therapy: Secondary | ICD-10-CM | POA: Diagnosis not present

## 2016-02-27 DIAGNOSIS — S20212A Contusion of left front wall of thorax, initial encounter: Secondary | ICD-10-CM

## 2016-02-27 DIAGNOSIS — Y92009 Unspecified place in unspecified non-institutional (private) residence as the place of occurrence of the external cause: Secondary | ICD-10-CM

## 2016-02-27 DIAGNOSIS — Y929 Unspecified place or not applicable: Secondary | ICD-10-CM | POA: Diagnosis not present

## 2016-02-27 DIAGNOSIS — X58XXXA Exposure to other specified factors, initial encounter: Secondary | ICD-10-CM | POA: Insufficient documentation

## 2016-02-27 DIAGNOSIS — Y999 Unspecified external cause status: Secondary | ICD-10-CM | POA: Insufficient documentation

## 2016-02-27 DIAGNOSIS — S20222A Contusion of left back wall of thorax, initial encounter: Secondary | ICD-10-CM | POA: Diagnosis not present

## 2016-02-27 DIAGNOSIS — S0101XA Laceration without foreign body of scalp, initial encounter: Secondary | ICD-10-CM | POA: Diagnosis present

## 2016-02-27 DIAGNOSIS — Y939 Activity, unspecified: Secondary | ICD-10-CM | POA: Diagnosis not present

## 2016-02-27 NOTE — ED Provider Notes (Signed)
[facility] DEPT Provider Note   CSN: [id number] Arrival date & time: 02/27/16  1943    History   Chief Complaint Chief Complaint  Patient presents with  . Fall    HPI Anna Coleman is a 65 y.o. female.  65 year old female with hx of HTN, hypothyroid, HLD, DM, and depression presents to the emergency department after a prep and fall at home at approximately 1900 this evening. Patient states that she was walking over a baby gait when her leg got caught causing her to fall forward and on her left side. She states that she struck her head on a dresser. She denies loss of consciousness. Patient with a laceration to her left parietal scalp as a result which is throbbing; however she denies these symptoms being c/w a headache. She does feel that her neck is "kinking up". She also reports falling on her left chest. She has noted some pain to her left rib cage. This pain is intermittent and aching. She denies worsening pain with deep breathing. No medications taken prior to arrival for symptoms. Patient denies nausea, vomiting, tinnitus, difficulty speaking or swallowing, extremity numbness or paresthesias, extremity weakness, inability to ambulate, and vision changes. She denies these of blood thinners.   The history is provided by the patient. No language interpreter was used.  Fall     Past Medical History:  Diagnosis Date  . Arthritis    "left shoulder; knees" (06/17/2015)  . Depression   . Hashimoto's disease   . Hyperlipidemia   . Hypertension   . Hypothyroidism   . Kidney stones   . Pneumonia 1960s X 1  . Thyroid disease   . Type II diabetes mellitus [facility])     Patient Active Problem List   Diagnosis Date Noted  . Chest pain 06/17/2015  . Type 2 diabetes mellitus without complication, without long-term current use of insulin ([facility]) 06/17/2015  . Essential hypertension 06/17/2015  . Hypothyroidism due to Hashimoto's thyroiditis 06/17/2015  . Depression 06/17/2015    Past  Surgical History:  Procedure Laterality Date  . CESAREAN SECTION  1977; 1979  . CHOLECYSTECTOMY OPEN  1980s  . COLON SURGERY  ~ 2010   "took 10i nches of my bowel out for diverticulitis"  . COLOSTOMY  ~ 2010  . COLOSTOMY CLOSURE  ~ 2010   "closed it 6 weeks after they put it in"  . CYST EXCISION  [phone]   "couple on my head; one on my butt, left forearm"  . GYNECOLOGIC CRYOSURGERY  ~ 1983   "froze my cervix"  . TUBAL LIGATION  1979    OB History    No data available       Home Medications    Prior to Admission medications   Medication Sig Start Date End Date Taking? Authorizing Provider  aspirin EC 81 MG tablet Take 81 mg by mouth daily.    Historical Provider, MD  Calcium Carb-Cholecalciferol (CALCIUM 600 + D PO) Take 1 tablet by mouth 2 (two) times daily.    Historical Provider, MD  carvedilol (COREG) 6.25 MG tablet Take 6.25 mg by mouth 2 (two) times daily with a meal.    Historical Provider, MD  cetirizine (ZYRTEC) 10 MG tablet Take 10 mg by mouth daily.    Historical Provider, MD  Cholecalciferol (VITAMIN D3) 2000 UNITS TABS Take 1 tablet by mouth daily.    Historical Provider, MD  CINNAMON PO Take 2 capsules by mouth daily.    Historical Provider, MD  citalopram (  CELEXA) 20 MG tablet Take 20 mg by mouth daily.    Historical Provider, MD  Cranberry 500 MG CAPS Take 1 capsule by mouth daily.    Historical Provider, MD  Garlic [id number] MG CAPS Take 1 capsule by mouth daily.    Historical Provider, MD  glipiZIDE (GLUCOTROL) 10 MG tablet Take 10 mg by mouth 2 (two) times daily before a meal.     Historical Provider, MD  HYDROcodone-acetaminophen (NORCO/VICODIN) 5-325 MG per tablet Take 2 tablets by mouth every 4 (four) hours as needed. Patient not taking: Reported on 06/17/2015 04/10/13   Teressa Lower, MD  levothyroxine (SYNTHROID, LEVOTHROID) 100 MCG tablet Take 100 mcg by mouth daily before breakfast.    Historical Provider, MD  losartan (COZAAR) 100 MG tablet Take 100 mg by  mouth daily.    Historical Provider, MD  metFORMIN (GLUCOPHAGE) 1000 MG tablet Take 1,000 mg by mouth 2 (two) times daily with a meal.    Historical Provider, MD  neomycin-polymyxin-hydrocortisone (CORTISPORIN) 3.5-10000-1 otic suspension Place 4 drops into the right ear 4 (four) times daily. Patient not taking: Reported on 06/17/2015 11/30/13   Billy Fischer, MD  promethazine (PHENERGAN) 25 MG tablet Take 1 tablet (25 mg total) by mouth every 6 (six) hours as needed for nausea or vomiting. Patient not taking: Reported on 06/17/2015 04/10/13   Teressa Lower, MD  vitamin B-12 (CYANOCOBALAMIN) 1000 MCG tablet Take 1,000 mcg by mouth daily.    Historical Provider, MD  vitamin E 400 UNIT capsule Take 400 Units by mouth daily.    Historical Provider, MD    Family History Family History  Problem Relation Age of Onset  . Heart failure Mother   . Diabetes Mother   . Diabetes Brother   . Stroke Brother     Social History Social History  Substance Use Topics  . Smoking status: Never Smoker  . Smokeless tobacco: Never Used  . Alcohol use No     Allergies   Ace inhibitors; Erythromycin; and Penicillins   Review of Systems Review of Systems Ten systems reviewed and are negative for acute change, except as noted in the HPI.    Physical Exam Updated Vital Signs BP 154/74 (BP Location: Right Arm)   Pulse (!) 55   Temp 98.3 F (36.8 C) (Oral)   Resp 18   Ht 5\' 6"  (1.676 m)   Wt 88.6 kg   SpO2 99%   BMI 31.52 kg/m   Physical Exam  Constitutional: She is oriented to person, place, and time. She appears well-developed and well-nourished. No distress.  HENT:  Head: Normocephalic.  Mouth/Throat: Oropharynx is clear and moist.  Puncture would/65mm laceration to left parietal scalp; mild serosanguineous weeping. No battle's sign or raccoon's eyes. No skull instability. Symmetric rise of the uvula with phonation.  Eyes: Conjunctivae and EOM are normal. Pupils are equal, round, and reactive to  light. No scleral icterus.  Neck:  C-collar applied in triage  Cardiovascular: Normal rate, regular rhythm and intact distal pulses.   Pulmonary/Chest: Effort normal. No respiratory distress. She has no wheezes. She has no rales. She exhibits tenderness.  Lungs clear bilaterally. Chest expansion symmetric. There is tenderness to palpation to the left lateral chest wall along the anterior axillary line. No bony deformity or crepitus.  Musculoskeletal: Normal range of motion.  Neurological: She is alert and oriented to person, place, and time. No cranial nerve deficit. She exhibits normal muscle tone. Coordination normal.  GCS 15. Speech is oriented. No  focal neurologic deficits appreciated.  Skin: Skin is warm and dry. No rash noted. She is not diaphoretic. No erythema. No pallor.  Psychiatric: She has a normal mood and affect. Her behavior is normal.  Nursing note and vitals reviewed.    ED Treatments / Results  Labs (all labs ordered are listed, but only abnormal results are displayed) Labs Reviewed - No data to display  EKG  EKG Interpretation None       Radiology Dg Ribs Unilateral W/chest Left  Result Date: 02/27/2016 CLINICAL DATA:  65 y/o F; status post fall with left lower rib pain. EXAM: LEFT RIBS AND CHEST - 3+ VIEW COMPARISON:  None. FINDINGS: BB marker over site of pain. No fracture or other bone lesions are seen involving the ribs. There is no evidence of pneumothorax or pleural effusion. Both lungs are clear. Heart size and mediastinal contours are within normal limits. IMPRESSION: No fracture identified. Electronically Signed   By: Kristine Garbe M.D.   On: 02/27/2016 22:32   Ct Head Wo Contrast  Result Date: 02/27/2016 CLINICAL DATA:  Fall at home with left parietal injury. Initial encounter. EXAM: CT HEAD WITHOUT CONTRAST CT CERVICAL SPINE WITHOUT CONTRAST TECHNIQUE: Multidetector CT imaging of the head and cervical spine was performed following the  standard protocol without intravenous contrast. Multiplanar CT image reconstructions of the cervical spine were also generated. COMPARISON:  None. FINDINGS: CT HEAD FINDINGS Brain: No evidence of acute infarction, hemorrhage, hydrocephalus, extra-axial collection or mass lesion/mass effect. Vascular: Mild atherosclerotic calcification. Skull: Negative for fracture Sinuses/Orbits: Negative CT CERVICAL SPINE FINDINGS Alignment: Mild C3-4 anterolisthesis, likely degenerative. Skull base and vertebrae: Negative for acute fracture Soft tissues and spinal canal: No prevertebral fluid or swelling. No visible canal hematoma. Disc levels: Degenerative disc narrowing greatest from C4-5 and C6-7, with endplate ridging greater on the right. Facet arthropathy, focally severe on the right at C2-3. Upper chest: Negative. IMPRESSION: No evidence of acute intracranial or cervical spine injury. Electronically Signed   By: Monte Fantasia M.D.   On: 02/27/2016 21:58   Ct Cervical Spine Wo Contrast  Result Date: 02/27/2016 CLINICAL DATA:  Fall at home with left parietal injury. Initial encounter. EXAM: CT HEAD WITHOUT CONTRAST CT CERVICAL SPINE WITHOUT CONTRAST TECHNIQUE: Multidetector CT imaging of the head and cervical spine was performed following the standard protocol without intravenous contrast. Multiplanar CT image reconstructions of the cervical spine were also generated. COMPARISON:  None. FINDINGS: CT HEAD FINDINGS Brain: No evidence of acute infarction, hemorrhage, hydrocephalus, extra-axial collection or mass lesion/mass effect. Vascular: Mild atherosclerotic calcification. Skull: Negative for fracture Sinuses/Orbits: Negative CT CERVICAL SPINE FINDINGS Alignment: Mild C3-4 anterolisthesis, likely degenerative. Skull base and vertebrae: Negative for acute fracture Soft tissues and spinal canal: No prevertebral fluid or swelling. No visible canal hematoma. Disc levels: Degenerative disc narrowing greatest from C4-5 and  C6-7, with endplate ridging greater on the right. Facet arthropathy, focally severe on the right at C2-3. Upper chest: Negative. IMPRESSION: No evidence of acute intracranial or cervical spine injury. Electronically Signed   By: Monte Fantasia M.D.   On: 02/27/2016 21:58    Procedures Procedures (including critical care time)  Medications Ordered in ED Medications - No data to display   Initial Impression / Assessment and Plan / ED Course  I have reviewed the triage vital signs and the nursing notes.  Pertinent labs & imaging results that were available during my care of the patient were reviewed by me and considered in my medical decision  making (see chart for details).  Clinical Course    65 year old female presents to the emergency department after a mechanical fall. She is complaining of head and neck pain as well as left chest wall pain. Laceration not large enough to warrant staples or sutures. Hair has been tied to approximate wound closure. No skull instability. Patient neurovascularly intact and with a nonfocal neurologic exam. Imaging today is reassuring, negative for acute traumatic injury. C-spine cleared and C-collar removed. Patient declines pain medicine while in the emergency department. She states that she can take Tylenol at home. I do not believe further emergent workup is indicated at this time. Patient discharged in stable condition with no unaddressed concerns.   Final Clinical Impressions(s) / ED Diagnoses   Final diagnoses:  Fall in home, initial encounter  Laceration of scalp, initial encounter  Chest wall contusion, left, initial encounter    New Prescriptions Discharge Medication List as of 02/27/2016 10:43 PM       Antonietta Breach, PA-C 02/27/16 [place], MD 02/28/16 1430

## 2016-02-27 NOTE — Discharge Instructions (Signed)
Take Tylenol or ibuprofen as needed for pain. Follow-up with your primary care doctor. Ask your primary care doctor if you are up-to-date on your tetanus shot. You should have this updated if you have not had a tetanus shot in the last 10 years. You may return for any new or concerning symptoms.

## 2016-02-27 NOTE — ED Triage Notes (Signed)
Pt. tripped and fell at home this evening , denies LOC / ambulatory , presents with small left lateral scalp laceration , headache , left lateral ribcage pain and posterior neck pain , C- collar applied at triage . Alert and oriented /respirations unlabored .

## 2017-03-19 ENCOUNTER — Encounter: Payer: Self-pay | Admitting: Gastroenterology

## 2017-04-02 ENCOUNTER — Other Ambulatory Visit: Payer: Self-pay | Admitting: Nurse Practitioner

## 2017-04-02 DIAGNOSIS — Z1231 Encounter for screening mammogram for malignant neoplasm of breast: Secondary | ICD-10-CM

## 2017-04-17 ENCOUNTER — Other Ambulatory Visit: Payer: Self-pay

## 2017-04-17 ENCOUNTER — Ambulatory Visit (AMBULATORY_SURGERY_CENTER): Payer: Self-pay | Admitting: *Deleted

## 2017-04-17 VITALS — Ht 66.0 in | Wt 192.2 lb

## 2017-04-17 DIAGNOSIS — Z1211 Encounter for screening for malignant neoplasm of colon: Secondary | ICD-10-CM

## 2017-04-17 MED ORDER — NA SULFATE-K SULFATE-MG SULF 17.5-3.13-1.6 GM/177ML PO SOLN: 1.0000 [IU] | Freq: Once | ORAL | 0 refills | Status: AC

## 2017-04-17 NOTE — Progress Notes (Signed)
No egg or soy allergy known to patient  No issues with past sedation with any surgeries  or procedures, no intubation problems  No diet pills per patient No home 02 use per patient  No blood thinners per patient  Pt denies issues with constipation  No A fib or A flutter  EMMI video sent to pt's e mail  

## 2017-04-24 ENCOUNTER — Encounter: Payer: Self-pay | Admitting: Gastroenterology

## 2017-04-27 ENCOUNTER — Ambulatory Visit
Admission: RE | Admit: 2017-04-27 | Discharge: 2017-04-27 | Disposition: A | Discharge status: Home / Self Care | Payer: Medicare HMO | Source: Ambulatory Visit | Attending: Nurse Practitioner | Admitting: Nurse Practitioner

## 2017-04-27 DIAGNOSIS — Z1231 Encounter for screening mammogram for malignant neoplasm of breast: Secondary | ICD-10-CM

## 2017-05-03 ENCOUNTER — Encounter: Payer: Self-pay | Admitting: Nurse Practitioner

## 2017-05-03 ENCOUNTER — Encounter: Payer: Medicare HMO | Admitting: Gastroenterology

## 2018-06-21 ENCOUNTER — Ambulatory Visit (AMBULATORY_SURGERY_CENTER): Payer: Self-pay | Admitting: *Deleted

## 2018-06-21 ENCOUNTER — Other Ambulatory Visit: Payer: Self-pay

## 2018-06-21 ENCOUNTER — Encounter: Payer: Self-pay | Admitting: Gastroenterology

## 2018-06-21 VITALS — Ht 66.0 in | Wt 190.0 lb

## 2018-06-21 DIAGNOSIS — R195 Other fecal abnormalities: Secondary | ICD-10-CM

## 2018-06-21 MED ORDER — NA SULFATE-K SULFATE-MG SULF 17.5-3.13-1.6 GM/177ML PO SOLN: 1.0000 [IU] | Freq: Once | ORAL | 0 refills | Status: AC

## 2018-06-21 NOTE — Progress Notes (Signed)
No egg or soy allergy known to patient  No issues with past sedation with any surgeries  or procedures, no intubation problems  No diet pills per patient No home 02 use per patient  No blood thinners per patient  Pt denies issues with constipation  No A fib or A flutter  EMMI video sent to pt's e mail  suprep sample given

## 2018-06-30 ENCOUNTER — Emergency Department (HOSPITAL_COMMUNITY): Payer: Medicare HMO

## 2018-06-30 ENCOUNTER — Encounter (HOSPITAL_COMMUNITY): Payer: Self-pay | Admitting: Emergency Medicine

## 2018-06-30 ENCOUNTER — Emergency Department (HOSPITAL_COMMUNITY)
Admission: EM | Admit: 2018-06-30 | Discharge: 2018-06-30 | Disposition: A | Discharge status: Home / Self Care | Payer: Medicare HMO | Attending: Emergency Medicine | Admitting: Emergency Medicine

## 2018-06-30 ENCOUNTER — Other Ambulatory Visit: Payer: Self-pay

## 2018-06-30 DIAGNOSIS — E119 Type 2 diabetes mellitus without complications: Secondary | ICD-10-CM | POA: Diagnosis not present

## 2018-06-30 DIAGNOSIS — K5730 Diverticulosis of large intestine without perforation or abscess without bleeding: Secondary | ICD-10-CM | POA: Insufficient documentation

## 2018-06-30 DIAGNOSIS — Z7984 Long term (current) use of oral hypoglycemic drugs: Secondary | ICD-10-CM | POA: Insufficient documentation

## 2018-06-30 DIAGNOSIS — Z79899 Other long term (current) drug therapy: Secondary | ICD-10-CM | POA: Insufficient documentation

## 2018-06-30 DIAGNOSIS — Z7982 Long term (current) use of aspirin: Secondary | ICD-10-CM | POA: Diagnosis not present

## 2018-06-30 DIAGNOSIS — E785 Hyperlipidemia, unspecified: Secondary | ICD-10-CM | POA: Diagnosis not present

## 2018-06-30 DIAGNOSIS — E039 Hypothyroidism, unspecified: Secondary | ICD-10-CM | POA: Diagnosis not present

## 2018-06-30 DIAGNOSIS — R1031 Right lower quadrant pain: Secondary | ICD-10-CM | POA: Diagnosis present

## 2018-06-30 DIAGNOSIS — I10 Essential (primary) hypertension: Secondary | ICD-10-CM | POA: Diagnosis not present

## 2018-06-30 LAB — URINALYSIS, ROUTINE W REFLEX MICROSCOPIC
Bilirubin Urine: NEGATIVE
Glucose, UA: NEGATIVE mg/dL
Hgb urine dipstick: NEGATIVE
Ketones, ur: NEGATIVE mg/dL
Nitrite: NEGATIVE
PH: 7 (ref 5.0–8.0)
Protein, ur: NEGATIVE mg/dL
SPECIFIC GRAVITY, URINE: 1.01 (ref 1.005–1.030)

## 2018-06-30 LAB — CBC WITH DIFFERENTIAL/PLATELET
Abs Immature Granulocytes: 0.01 10*3/uL (ref 0.00–0.07)
BASOS PCT: 0 %
Basophils Absolute: 0 10*3/uL (ref 0.0–0.1)
Eosinophils Absolute: 0.4 10*3/uL (ref 0.0–0.5)
Eosinophils Relative: 5 %
HCT: 36.8 % (ref 36.0–46.0)
Hemoglobin: 12.3 g/dL (ref 12.0–15.0)
Immature Granulocytes: 0 %
Lymphocytes Relative: 35 %
Lymphs Abs: 2.4 10*3/uL (ref 0.7–4.0)
MCH: 28.9 pg (ref 26.0–34.0)
MCHC: 33.4 g/dL (ref 30.0–36.0)
MCV: 86.6 fL (ref 80.0–100.0)
Monocytes Absolute: 0.6 10*3/uL (ref 0.1–1.0)
Monocytes Relative: 9 %
Neutro Abs: 3.6 10*3/uL (ref 1.7–7.7)
Neutrophils Relative %: 51 %
Platelets: 259 10*3/uL (ref 150–400)
RBC: 4.25 MIL/uL (ref 3.87–5.11)
RDW: 13 % (ref 11.5–15.5)
WBC: 7 10*3/uL (ref 4.0–10.5)
nRBC: 0 % (ref 0.0–0.2)

## 2018-06-30 LAB — COMPREHENSIVE METABOLIC PANEL
ALT: 13 U/L (ref 0–44)
ANION GAP: 13 (ref 5–15)
AST: 19 U/L (ref 15–41)
Albumin: 3.9 g/dL (ref 3.5–5.0)
Alkaline Phosphatase: 56 U/L (ref 38–126)
BUN: 8 mg/dL (ref 8–23)
CO2: 22 mmol/L (ref 22–32)
Calcium: 9.1 mg/dL (ref 8.9–10.3)
Chloride: 103 mmol/L (ref 98–111)
Creatinine, Ser: 0.78 mg/dL (ref 0.44–1.00)
GFR calc Af Amer: >60 mL/min (ref >60)
GFR calc non Af Amer: >60 mL/min (ref >60)
Glucose, Bld: 193 mg/dL — ABNORMAL HIGH (ref 70–99)
Potassium: 4 mmol/L (ref 3.5–5.1)
SODIUM: 138 mmol/L (ref 135–145)
Total Bilirubin: 0.8 mg/dL (ref 0.3–1.2)
Total Protein: 6.8 g/dL (ref 6.5–8.1)

## 2018-06-30 LAB — LIPASE, BLOOD: Lipase: 24 U/L (ref 11–51)

## 2018-06-30 MED ORDER — IOHEXOL 300 MG/ML  SOLN
100.0000 mL | Freq: Once | INTRAMUSCULAR | Status: AC | PRN
  Administered 2018-06-30: 100 mL via INTRAVENOUS

## 2018-06-30 MED ORDER — SODIUM CHLORIDE 0.9 % IV BOLUS
1000.0000 mL | Freq: Once | INTRAVENOUS | Status: AC
  Administered 2018-06-30: 1000 mL via INTRAVENOUS

## 2018-06-30 NOTE — ED Notes (Signed)
Patient transported to CT 

## 2018-06-30 NOTE — ED Notes (Signed)
Pt tolerated gingerale well 

## 2018-06-30 NOTE — Discharge Instructions (Addendum)
Please keep your appointment with your GI doctor.  Today your CT scan was reassuring.  You do have diverticuli and your left-sided large intestine on CT scan without evidence of infection.  Please make sure you are drinking extra water.  Please do not take your metformin for 3 days.  Please watch what you eat during this time as your sugars will run higher.  Your sugar was elevated today in the emergency room.  Please follow-up with your primary care doctor.  Your blood pressure was also high.  I recommend getting this rechecked in 1 week.

## 2018-06-30 NOTE — ED Triage Notes (Signed)
Pt here for eval of abdominal pain to RUQ. Has hx of colostomy. Pt had diarrhea yesterday X 2, once today. No nausea.

## 2018-06-30 NOTE — ED Provider Notes (Signed)
[place] EMERGENCY DEPARTMENT Provider Note   CSN: [id number] Arrival date & time: 06/30/18  1421     History   Chief Complaint Chief Complaint  Patient presents with  . Abdominal Pain    HPI Anna Coleman is a 68 y.o. female with past medical history of DM 2, diverticulitis, hypertension, partial colectomy with colostomy that has since been taken down, who presents today for evaluation of abdominal pain in the right upper quadrant.  She reports that this area is where she previously had her colostomy.  She says that for the past 10 years it will intermittently hurt however it is normally not severe and does not last long.  Since last night she has had continuous pain over her former colostomy site.  It does not radiate or move.  She says that it was aggravated after she picked up a couch last night.  She reports diarrhea x2 yesterday, which is not abnormal for her.  She denies any nausea or vomiting.  No fevers.  She denies any urinary frequency or urgency.  No dysuria.  No chest pain or shortness of breath.  There was no blood in her diarrhea.  HPI  Past Medical History:  Diagnosis Date  . Allergy   . Arthritis    "left shoulder; knees" (06/17/2015)  . Cataract   . Depression   . Diverticulitis   . Hashimoto's disease   . Hyperlipidemia   . Hypertension   . Hypothyroidism   . Kidney stones   . Pneumonia 1960s X 1  . Thyroid disease   . Type II diabetes mellitus [facility])     Patient Active Problem List   Diagnosis Date Noted  . Chest pain 06/17/2015  . Type 2 diabetes mellitus without complication, without long-term current use of insulin ([place]) 06/17/2015  . Essential hypertension 06/17/2015  . Hypothyroidism due to Hashimoto's thyroiditis 06/17/2015  . Depression 06/17/2015    Past Surgical History:  Procedure Laterality Date  . CARPAL TUNNEL RELEASE Right   . CESAREAN SECTION  1977; 1979  . CHOLECYSTECTOMY OPEN  1980s  . COLON SURGERY  ~ 2010   "took 10i nches of my bowel out for diverticulitis"  . COLONOSCOPY    . COLOSTOMY  ~ 2010  . COLOSTOMY CLOSURE  ~ 2010   "closed it 6 weeks after they put it in"  . CYST EXCISION  [phone]   "couple on my head; one on my butt, left forearm"  . diverticulitis    . GYNECOLOGIC CRYOSURGERY  ~ 1983   "froze my cervix"  . POLYPECTOMY    . TUBAL LIGATION  1979     OB History   No obstetric history on file.      Home Medications    Prior to Admission medications   Medication Sig Start Date End Date Taking? Authorizing Provider  aspirin EC 81 MG tablet Take 81 mg by mouth daily.   Yes [provider]  Calcium Carb-Cholecalciferol (CALCIUM 600 + D PO) Take 1 tablet by mouth 2 (two) times daily.   Yes [provider]  carvedilol (COREG) 6.25 MG tablet Take 6.25 mg by mouth 2 (two) times daily with a meal.   Yes [provider]  CINNAMON PO Take 1 tablet by mouth daily.   Yes [provider]  citalopram (CELEXA) 20 MG tablet Take 20 mg by mouth daily.   Yes [provider]  glipiZIDE (GLUCOTROL) 10 MG tablet Take 10 mg by mouth 2 (  two) times daily before a meal.    Yes [provider]  levothyroxine (SYNTHROID, LEVOTHROID) 100 MCG tablet Take 100 mcg by mouth daily.    Yes [provider]  loperamide (IMODIUM A-D) 2 MG tablet Take 4 mg by mouth See admin instructions. Take 2 tablets (4 mg) by mouth at onset of diarrhea/loose stools, may take a 2nd dose of 1 tablet (2 mg) if still needed   Yes [provider]  lovastatin (MEVACOR) 40 MG tablet Take 40 mg by mouth at bedtime.  02/20/17  Yes [provider]  metFORMIN (GLUCOPHAGE) 1000 MG tablet Take 1,000 mg by mouth 2 (two) times daily with a meal.   Yes [provider]  Glucose Blood (BLOOD GLUCOSE TEST STRIPS) STRP daily.     [provider]  HYDROcodone-acetaminophen (NORCO/VICODIN) 5-325 MG per tablet Take 2 tablets by mouth every 4 (four)  hours as needed. Patient not taking: Reported on 06/17/2015 04/10/13   Teressa Lower, MD  neomycin-polymyxin-hydrocortisone (CORTISPORIN) 3.5-10000-1 otic suspension Place 4 drops into the right ear 4 (four) times daily. Patient not taking: Reported on 04/17/2017 11/30/13   Billy Fischer, MD  promethazine (PHENERGAN) 25 MG tablet Take 1 tablet (25 mg total) by mouth every 6 (six) hours as needed for nausea or vomiting. Patient not taking: Reported on 06/17/2015 04/10/13   Teressa Lower, MD    Family History Family History  Problem Relation Age of Onset  . Heart failure Mother   . Diabetes Mother   . Diabetes Brother   . Stroke Brother   . Colon cancer Neg Hx   . Colon polyps Neg Hx   . Esophageal cancer Neg Hx   . Rectal cancer Neg Hx   . Stomach cancer Neg Hx     Social History Social History   Tobacco Use  . Smoking status: Never Smoker  . Smokeless tobacco: Never Used  Substance Use Topics  . Alcohol use: No  . Drug use: No     Allergies   Ace inhibitors; Erythromycin; and Penicillins   Review of Systems Review of Systems  Constitutional: Negative for chills and fever.  HENT: Negative for congestion.   Respiratory: Negative for chest tightness and shortness of breath.   Cardiovascular: Negative for chest pain and leg swelling.  Gastrointestinal: Positive for abdominal pain and diarrhea. Negative for blood in stool, constipation, nausea and vomiting.  Genitourinary: Negative for dysuria, frequency, hematuria and urgency.  Musculoskeletal: Negative for back pain and neck pain.  Psychiatric/Behavioral: Negative for confusion.  All other systems reviewed and are negative.    Physical Exam Updated Vital Signs BP (!) 152/61 (BP Location: Right Arm)   Pulse (!) 56   Temp 97.9 F (36.6 C) (Oral)   Resp 16   Ht 5\' 6"  (1.676 m)   Wt 85.3 kg   SpO2 100%   BMI 30.34 kg/m   Physical Exam Vitals signs and nursing note reviewed.  Constitutional:      General: She is  not in acute distress.    Appearance: She is well-developed. She is not ill-appearing.  HENT:     Head: Normocephalic and atraumatic.  Eyes:     Conjunctiva/sclera: Conjunctivae normal.  Neck:     Musculoskeletal: Neck supple.  Cardiovascular:     Rate and Rhythm: Normal rate and regular rhythm.     Heart sounds: Normal heart sounds. No murmur.  Pulmonary:     Effort: Pulmonary effort is normal. No respiratory distress.  Breath sounds: Normal breath sounds.  Abdominal:     General: Abdomen is flat. A surgical scar is present. Bowel sounds are normal. There is no distension.     Palpations: Abdomen is soft.     Tenderness: There is abdominal tenderness in the right upper quadrant and right lower quadrant.     Hernia: A hernia (Right sided incisional.  Bowel vs scar tissue palpable through abdominal wall.  Unable to reduce. Does not get larger with bearing down or coughing. ) is present.  Skin:    General: Skin is warm and dry.  Neurological:     Mental Status: She is alert.      ED Treatments / Results  Labs (all labs ordered are listed, but only abnormal results are displayed) Labs Reviewed  COMPREHENSIVE METABOLIC PANEL - Abnormal; Notable for the following components:      Result Value   Glucose, Bld 193 (*)    All other components within normal limits  URINALYSIS, ROUTINE W REFLEX MICROSCOPIC - Abnormal; Notable for the following components:   Leukocytes, UA TRACE (*)    Bacteria, UA RARE (*)    All other components within normal limits  LIPASE, BLOOD  CBC WITH DIFFERENTIAL/PLATELET    EKG None  Radiology Ct Abdomen Pelvis W Contrast  Result Date: 06/30/2018 CLINICAL DATA:  Right upper quadrant abdominal pain. History of a colostomy. Diarrhea yesterday x2, once today. Partial colectomy for diverticulitis. EXAM: CT ABDOMEN AND PELVIS WITH CONTRAST TECHNIQUE: Multidetector CT imaging of the abdomen and pelvis was performed using the standard protocol following  bolus administration of intravenous contrast. CONTRAST:  146mL OMNIPAQUE IOHEXOL 300 MG/ML  SOLN COMPARISON:  04/10/2013 FINDINGS: Lower chest: Clear lung bases. Hepatobiliary: No focal liver abnormality is seen. Status post cholecystectomy. No biliary dilatation. Pancreas: Unremarkable. No pancreatic ductal dilatation or surrounding inflammatory changes. Spleen: Normal in size without focal abnormality. Adrenals/Urinary Tract: No adrenal masses. Kidneys normal size, orientation and position. 4 mm low-density lesion, medial midpole of the left kidney, most likely a cyst. No other renal masses or lesions, no stones and no hydronephrosis. Ureters are normal course and in caliber. Bladder is unremarkable. Stomach/Bowel: Normal stomach. Small bowel is normal in caliber. No wall thickening or inflammation. There is a small bowel anastomosis staple line in the right lower quadrant. No adjacent inflammation. There are scattered colonic diverticula along the left colon. No evidence of diverticulitis. Colon is normal in caliber. No wall thickening or other inflammatory process. Normal appendix visualized. Vascular/Lymphatic: Aortic atherosclerosis. No enlarged abdominal or pelvic lymph nodes. Reproductive: Uterus and bilateral adnexa are unremarkable. Other: Scarring in the right lower quadrant deep subcutaneous soft tissues reflecting the prior colostomy site. No abdominal wall hernia. No ascites. Musculoskeletal: No fracture or acute finding. No osteoblastic or osteolytic lesions. IMPRESSION: 1. No acute findings. No findings to account for right upper quadrant pain. 2. There are residual left colon diverticula. No evidence of diverticulitis. 3. Status post cholecystectomy. 4. Aortic atherosclerosis. Electronically Signed   By: Lajean Manes M.D.   On: 06/30/2018 17:54    Procedures Procedures (including critical care time)  Medications Ordered in ED Medications  iohexol (OMNIPAQUE) 300 MG/ML solution 100 mL (100  mLs Intravenous Contrast Given 06/30/18 1733)  sodium chloride 0.9 % bolus 1,000 mL (0 mLs Intravenous Stopped 06/30/18 2026)     Initial Impression / Assessment and Plan / ED Course  I have reviewed the triage vital signs and the nursing notes.  Pertinent labs & imaging results  that were available during my care of the patient were reviewed by me and considered in my medical decision making (see chart for details).    Patient is a 68 year old woman who presents today for evaluation of abdominal pain primarily around the right upper quadrant where she had a colostomy years ago.  Labs were obtained and reviewed, she does not have a significant leukocytosis.  She is not anemic.  Her sugar is slightly high at 193, over she does not have any other significant electrolyte or hematologic derangements.  Her urine shows trace leukocytes with rare bacteria however she is not having any urinary symptoms therefore will not treat.  CT scan of abdomen was performed showing residual left colon diverticuli without evidence of diverticulitis.  She does not have any evidence of obstruction or cause for her pain.  Suspect that her pain is incisional and may be related to scar tissue.  She was able to p.o. challenge without difficulty in the department.  She was given 1 L of saline as she takes metformin, and instructed to increase p.o. fluid.  She is instructed to not take her metformin for 3 days.  She has a follow-up appointment later this week with GI for colonoscopy.  She was encouraged to keep this appointment.  Return precautions were discussed with patient who states their understanding.  At the time of discharge patient denied any unaddressed complaints or concerns.  Patient is agreeable for discharge home.  This patient was seen as a shared visit with Dr. Sherry Ruffing.   Final Clinical Impressions(s) / ED Diagnoses   Final diagnoses:  Right lower quadrant abdominal pain  Diverticulosis of colon    ED  Discharge Orders    None       Ollen Gross 06/30/18 2342    Tegeler, Gwenyth Allegra, MD 07/01/18 1122

## 2018-07-05 ENCOUNTER — Encounter: Payer: Self-pay | Admitting: Gastroenterology

## 2018-07-05 ENCOUNTER — Ambulatory Visit (AMBULATORY_SURGERY_CENTER): Payer: Medicare HMO | Admitting: Gastroenterology

## 2018-07-05 VITALS — BP 166/72 | HR 51 | Temp 98.6°F | Resp 15 | Ht 66.0 in | Wt 190.0 lb

## 2018-07-05 DIAGNOSIS — D123 Benign neoplasm of transverse colon: Secondary | ICD-10-CM

## 2018-07-05 DIAGNOSIS — D122 Benign neoplasm of ascending colon: Secondary | ICD-10-CM

## 2018-07-05 DIAGNOSIS — R195 Other fecal abnormalities: Secondary | ICD-10-CM

## 2018-07-05 HISTORY — PX: COLONOSCOPY: SHX174

## 2018-07-05 MED ORDER — SODIUM CHLORIDE 0.9 % IV SOLN: 500.0000 mL | Freq: Once | INTRAVENOUS | Status: DC

## 2018-07-05 NOTE — Progress Notes (Signed)
Called to room to assist during endoscopic procedure.  Patient ID and intended procedure confirmed with present staff. Received instructions for my participation in the procedure from the performing physician.  

## 2018-07-05 NOTE — Patient Instructions (Signed)
Information on polyps given to you today.   Await pathology results.  Repeat colonoscopy in six months.  No high dose aspirin, ibuprofen, naproxen or other non-steroidal anti-inflammatory drugs for 2 weeks.  YOU HAD AN ENDOSCOPIC PROCEDURE TODAY AT [facility]:   Refer to the procedure report that was given to you for any specific questions about what was found during the examination.  If the procedure report does not answer your questions, please call your gastroenterologist to clarify.  If you requested that your care partner not be given the details of your procedure findings, then the procedure report has been included in a sealed envelope for you to review at your convenience later.  YOU SHOULD EXPECT: Some feelings of bloating in the abdomen. Passage of more gas than usual.  Walking can help get rid of the air that was put into your GI tract during the procedure and reduce the bloating. If you had a lower endoscopy (such as a colonoscopy or flexible sigmoidoscopy) you may notice spotting of blood in your stool or on the toilet paper. If you underwent a bowel prep for your procedure, you may not have a normal bowel movement for a few days.  Please Note:  You might notice some irritation and congestion in your nose or some drainage.  This is from the oxygen used during your procedure.  There is no need for concern and it should clear up in a day or so.  SYMPTOMS TO REPORT IMMEDIATELY:   Following lower endoscopy (colonoscopy or flexible sigmoidoscopy):  Excessive amounts of blood in the stool  Significant tenderness or worsening of abdominal pains  Swelling of the abdomen that is new, acute  Fever of 100F or higher  For urgent or emergent issues, a gastroenterologist can be reached at any hour by calling [phone].   DIET:  We do recommend a small meal at first, but then you may proceed to your regular diet.  Drink plenty of fluids but you should avoid alcoholic  beverages for 24 hours.  ACTIVITY:  You should plan to take it easy for the rest of today and you should NOT DRIVE or use heavy machinery until tomorrow (because of the sedation medicines used during the test).    FOLLOW UP: Our staff will call the number listed on your records the next business day following your procedure to check on you and address any questions or concerns that you may have regarding the information given to you following your procedure. If we do not reach you, we will leave a message.  However, if you are feeling well and you are not experiencing any problems, there is no need to return our call.  We will assume that you have returned to your regular daily activities without incident.  If any biopsies were taken you will be contacted by phone or by letter within the next 1-3 weeks.  Please call us at [phone] if you have not heard about the biopsies in 3 weeks.    SIGNATURES/CONFIDENTIALITY: You and/or your care partner have signed paperwork which will be entered into your electronic medical record.  These signatures attest to the fact that that the information above on your After Visit Summary has been reviewed and is understood.  Full responsibility of the confidentiality of this discharge information lies with you and/or your care-partner.

## 2018-07-05 NOTE — Op Note (Signed)
[place] [place] Patient Name: Anna Coleman Procedure Date: 07/05/2018 11:30 AM MRN: [id number] Endoscopist: Mauri Pole , MD Age: 68 Referring MD:  Date of Birth: Dec 31, 1950 Gender: Female Account #: [id number] Procedure:                Colonoscopy Indications:              Positive Cologuard test Medicines:                Monitored Anesthesia Care Procedure:                Pre-Anesthesia Assessment:                           - Prior to the procedure, a History and Physical                            was performed, and patient medications and                            allergies were reviewed. The patient's tolerance of                            previous anesthesia was also reviewed. The risks                            and benefits of the procedure and the sedation                            options and risks were discussed with the patient.                            All questions were answered, and informed consent                            was obtained. Prior Anticoagulants: The patient has                            taken no previous anticoagulant or antiplatelet                            agents. ASA Grade Assessment: II - A patient with                            mild systemic disease. After reviewing the risks                            and benefits, the patient was deemed in                            satisfactory condition to undergo the procedure.                           After obtaining informed consent, the colonoscope  was passed under direct vision. Throughout the                            procedure, the patient's blood pressure, pulse, and                            oxygen saturations were monitored continuously. The                            Colonoscope was introduced through the anus and                            advanced to the the cecum, identified by                            appendiceal orifice and ileocecal valve.  The                            colonoscopy was performed without difficulty. The                            patient tolerated the procedure well. The quality                            of the bowel preparation was adequate. The                            ileocecal valve, appendiceal orifice, and rectum                            were photographed. Scope In: 11:41:08 AM Scope Out: 12:24:34 PM Scope Withdrawal Time: 0 hours 41 minutes 37 seconds  Total Procedure Duration: 0 hours 43 minutes 26 seconds  Findings:                 The perianal and digital rectal examinations were                            normal.                           A 5 mm polyp was found in the ascending colon. The                            polyp was sessile. The polyp was removed with a                            cold snare. Resection and retrieval were complete.                           A 18 mm polyp was found in the mid ascending colon.                            The polyp was mixed lateral spreading. The polyp  was removed with a piecemeal technique using a cold                            snare. Resection and retrieval were complete.                            Opposite fold area was tattooed with an injection                            of 2 mL of Spot (carbon black).                           A 2 mm polyp was found in the transverse colon. The                            polyp was sessile. The polyp was removed with a                            cold biopsy forceps. Resection and retrieval were                            complete.                           Multiple small and large-mouthed diverticula were                            found in the sigmoid colon and descending colon.                           Non-bleeding internal hemorrhoids were found during                            retroflexion. The hemorrhoids were small. Complications:            No immediate complications. Estimated  Blood Loss:     Estimated blood loss was minimal. Impression:               - One 5 mm polyp in the ascending colon, removed                            with a cold snare. Resected and retrieved.                           - One 18 mm polyp in the mid ascending colon,                            removed piecemeal using a cold snare. Resected and                            retrieved. Tattooed.                           - One 2 mm polyp in the transverse colon, removed  with a cold biopsy forceps. Resected and retrieved.                           - Moderate diverticulosis in the sigmoid colon and                            in the descending colon.                           - Non-bleeding internal hemorrhoids. Recommendation:           - Patient has a contact number available for                            emergencies. The signs and symptoms of potential                            delayed complications were discussed with the                            patient. Return to normal activities tomorrow.                            Written discharge instructions were provided to the                            patient.                           - Resume previous diet.                           - Continue present medications.                           - Await pathology results.                           - Repeat colonoscopy in 6 months for surveillance                            after piecemeal polypectomy.                           - No high dose aspirin, ibuprofen, naproxen, or                            other non-steroidal anti-inflammatory drugs for 2                            weeks. Mauri Pole, MD 07/05/2018 12:32:04 PM This report has been signed electronically.

## 2018-07-05 NOTE — Progress Notes (Signed)
A and O x3. Report to RN. Tolerated MAC anesthesia well.

## 2018-07-08 ENCOUNTER — Telehealth: Payer: Self-pay

## 2018-07-08 NOTE — Telephone Encounter (Signed)
NO ANSWER, MESSAGE LEFT FOR PATIENT. 

## 2018-07-08 NOTE — Telephone Encounter (Signed)
  Follow up Call-  Call back number 07/05/2018  Post procedure Call Back phone  # [phone]  Permission to leave phone message Yes  Some recent data might be hidden     Patient questions:  Do you have a fever, pain , or abdominal swelling? No. Pain Score  0 *  Have you tolerated food without any problems? Yes.    Have you been able to return to your normal activities? Yes.    Do you have any questions about your discharge instructions: Diet   No. Medications  No. Follow up visit  No.  Do you have questions or concerns about your Care? No.  Actions: * If pain score is 4 or above: No action needed, pain <4.

## 2018-07-17 ENCOUNTER — Encounter: Payer: Self-pay | Admitting: Gastroenterology

## 2018-11-14 ENCOUNTER — Other Ambulatory Visit: Payer: Self-pay | Admitting: Nurse Practitioner

## 2018-11-14 DIAGNOSIS — Z1231 Encounter for screening mammogram for malignant neoplasm of breast: Secondary | ICD-10-CM

## 2018-11-19 ENCOUNTER — Other Ambulatory Visit: Payer: Self-pay | Admitting: Nurse Practitioner

## 2018-11-19 DIAGNOSIS — M8580 Other specified disorders of bone density and structure, unspecified site: Secondary | ICD-10-CM

## 2018-12-26 ENCOUNTER — Encounter: Payer: Self-pay | Admitting: Gastroenterology

## 2019-01-15 ENCOUNTER — Other Ambulatory Visit: Payer: Self-pay

## 2019-01-15 ENCOUNTER — Ambulatory Visit (AMBULATORY_SURGERY_CENTER): Payer: Self-pay | Admitting: *Deleted

## 2019-01-15 VITALS — Temp 97.1°F | Ht 66.0 in | Wt 200.0 lb

## 2019-01-15 DIAGNOSIS — Z86010 Personal history of colonic polyps: Secondary | ICD-10-CM

## 2019-01-15 MED ORDER — PLENVU 140 G PO SOLR: 1.0000 | Freq: Once | ORAL | 0 refills | Status: AC

## 2019-01-15 NOTE — Progress Notes (Signed)
Patient denies any allergies to eggs or soy. Patient denies any problems with anesthesia/sedation. Patient denies any oxygen use at home. Patient denies taking any diet/weight loss medications or blood thinners. EMMI education assisgned to patient on colonoscopy, this was explained and instructions given to patient.Pt is aware that care partner will wait in the car during procedure; if they feel like they will be too hot to wait in the car; they may wait in the lobby.  We want them to wear a mask (we do not have any that we can provide them), practice social distancing, and we will check their temperatures when they get here.  I did remind patient that their care partner needs to stay in the parking lot the entire time. Pt will wear mask into building.  Plenvu sample given to pt per her request for a sample. She states her insurance did not cover suprep last time.

## 2019-01-22 ENCOUNTER — Encounter: Payer: Self-pay | Admitting: Gastroenterology

## 2019-01-28 ENCOUNTER — Telehealth: Payer: Self-pay

## 2019-01-28 NOTE — Telephone Encounter (Signed)
Covid-19 screening questions   Do you now or have you had a fever in the last 14 days? NO   Do you have any respiratory symptoms of shortness of breath or cough now or in the last 14 days? NO  Do you have any family members or close contacts with diagnosed or suspected Covid-19 in the past 14 days? NO  Have you been tested for Covid-19 and found to be positive? NO        

## 2019-01-29 ENCOUNTER — Other Ambulatory Visit: Payer: Self-pay

## 2019-01-29 ENCOUNTER — Ambulatory Visit (AMBULATORY_SURGERY_CENTER): Payer: Medicare HMO | Admitting: Gastroenterology

## 2019-01-29 ENCOUNTER — Encounter: Payer: Self-pay | Admitting: Gastroenterology

## 2019-01-29 VITALS — BP 115/61 | HR 53 | Temp 97.6°F | Resp 20 | Ht 66.0 in | Wt 200.0 lb

## 2019-01-29 DIAGNOSIS — Z86010 Personal history of colonic polyps: Secondary | ICD-10-CM | POA: Diagnosis present

## 2019-01-29 DIAGNOSIS — D120 Benign neoplasm of cecum: Secondary | ICD-10-CM

## 2019-01-29 MED ORDER — SODIUM CHLORIDE 0.9 % IV SOLN: 500.0000 mL | Freq: Once | INTRAVENOUS | Status: DC

## 2019-01-29 NOTE — Patient Instructions (Signed)
Please read handouts provided. Continue present medications. Await pathology results.        YOU HAD AN ENDOSCOPIC PROCEDURE TODAY AT THE [facility]:   Refer to the procedure report that was given to you for any specific questions about what was found during the examination.  If the procedure report does not answer your questions, please call your gastroenterologist to clarify.  If you requested that your care partner not be given the details of your procedure findings, then the procedure report has been included in a sealed envelope for you to review at your convenience later.  YOU SHOULD EXPECT: Some feelings of bloating in the abdomen. Passage of more gas than usual.  Walking can help get rid of the air that was put into your GI tract during the procedure and reduce the bloating. If you had a lower endoscopy (such as a colonoscopy or flexible sigmoidoscopy) you may notice spotting of blood in your stool or on the toilet paper. If you underwent a bowel prep for your procedure, you may not have a normal bowel movement for a few days.  Please Note:  You might notice some irritation and congestion in your nose or some drainage.  This is from the oxygen used during your procedure.  There is no need for concern and it should clear up in a day or so.  SYMPTOMS TO REPORT IMMEDIATELY:   Following lower endoscopy (colonoscopy or flexible sigmoidoscopy):  Excessive amounts of blood in the stool  Significant tenderness or worsening of abdominal pains  Swelling of the abdomen that is new, acute  Fever of 100F or higher    For urgent or emergent issues, a gastroenterologist can be reached at any hour by calling [phone].   DIET:  We do recommend a small meal at first, but then you may proceed to your regular diet.  Drink plenty of fluids but you should avoid alcoholic beverages for 24 hours.  ACTIVITY:  You should plan to take it easy for the rest of today and you should NOT  DRIVE or use heavy machinery until tomorrow (because of the sedation medicines used during the test).    FOLLOW UP: Our staff will call the number listed on your records 48-72 hours following your procedure to check on you and address any questions or concerns that you may have regarding the information given to you following your procedure. If we do not reach you, we will leave a message.  We will attempt to reach you two times.  During this call, we will ask if you have developed any symptoms of COVID 19. If you develop any symptoms (ie: fever, flu-like symptoms, shortness of breath, cough etc.) before then, please call [phone].  If you test positive for Covid 19 in the 2 weeks post procedure, please call and report this information to us.    If any biopsies were taken you will be contacted by phone or by letter within the next 1-3 weeks.  Please call us at [phone] if you have not heard about the biopsies in 3 weeks.    SIGNATURES/CONFIDENTIALITY: You and/or your care partner have signed paperwork which will be entered into your electronic medical record.  These signatures attest to the fact that that the information above on your After Visit Summary has been reviewed and is understood.  Full responsibility of the confidentiality of this discharge information lies with you and/or your care-partner. 

## 2019-01-29 NOTE — Op Note (Signed)
[facility] Patient Name: Cashae Paredez Procedure Date: 01/29/2019 10:35 AM MRN: [id number] Endoscopist: Mauri Pole , MD Age: 68 Referring MD:  Date of Birth: 15-Sep-1950 Gender: Female Account #: [id number] Procedure:                Colonoscopy Indications:              Surveillance: Personal history of piecemeal removal                            of adenoma on last colonoscopy 6 months ago Medicines:                Monitored Anesthesia Care Procedure:                Pre-Anesthesia Assessment:                           - Prior to the procedure, a History and Physical                            was performed, and patient medications and                            allergies were reviewed. The patient's tolerance of                            previous anesthesia was also reviewed. The risks                            and benefits of the procedure and the sedation                            options and risks were discussed with the patient.                            All questions were answered, and informed consent                            was obtained. Prior Anticoagulants: The patient has                            taken no previous anticoagulant or antiplatelet                            agents. ASA Grade Assessment: II - A patient with                            mild systemic disease. After reviewing the risks                            and benefits, the patient was deemed in                            satisfactory condition to undergo the procedure.  After obtaining informed consent, the colonoscope                            was passed under direct vision. Throughout the                            procedure, the patient's blood pressure, pulse, and                            oxygen saturations were monitored continuously. The                            Colonoscope was introduced through the anus and                            advanced to  the the cecum, identified by                            appendiceal orifice and ileocecal valve. The                            colonoscopy was performed without difficulty. The                            patient tolerated the procedure well. The quality                            of the bowel preparation was excellent. The                            ileocecal valve, appendiceal orifice, and rectum                            were photographed. Scope In: 10:54:42 AM Scope Out: 11:10:22 AM Scope Withdrawal Time: 0 hours 12 minutes 14 seconds  Total Procedure Duration: 0 hours 15 minutes 40 seconds  Findings:                 The perianal and digital rectal examinations were                            normal.                           A 3 mm polyp was found in the cecum. The polyp was                            sessile. The polyp was removed with a cold snare.                            Resection and retrieval were complete.                           A single small localized angioectasia was found in  the cecum.                           Scattered small and large-mouthed diverticula were                            found in the sigmoid colon, descending colon and                            ascending colon.                           Non-bleeding internal hemorrhoids were found during                            retroflexion. The hemorrhoids were small. Complications:            No immediate complications. Estimated Blood Loss:     Estimated blood loss was minimal. Impression:               - One 3 mm polyp in the cecum, removed with a cold                            snare. Resected and retrieved.                           - A single colonic angioectasia.                           - Diverticulosis in the sigmoid colon, in the                            descending colon and in the ascending colon.                           - Non-bleeding internal  hemorrhoids. Recommendation:           - Patient has a contact number available for                            emergencies. The signs and symptoms of potential                            delayed complications were discussed with the                            patient. Return to normal activities tomorrow.                            Written discharge instructions were provided to the                            patient.                           - Resume previous diet.                           -  Continue present medications.                           - Await pathology results.                           - Repeat colonoscopy in 3 years for surveillance                            based on pathology results. Mauri Pole, MD 01/29/2019 11:19:57 AM This report has been signed electronically.

## 2019-01-29 NOTE — Progress Notes (Signed)
Called to room to assist during endoscopic procedure.  Patient ID and intended procedure confirmed with present staff. Received instructions for my participation in the procedure from the performing physician.  

## 2019-01-29 NOTE — Progress Notes (Signed)
[place]   Pt's states no medical or surgical changes since previsit or office visit.

## 2019-01-29 NOTE — Progress Notes (Signed)
PT taken to PACU. Monitors in place. VSS. Report given to RN. 

## 2019-01-31 ENCOUNTER — Telehealth: Payer: Self-pay | Admitting: *Deleted

## 2019-01-31 NOTE — Telephone Encounter (Signed)
  Follow up Call-  Call back number 01/29/2019 07/05/2018  Post procedure Call Back phone  # [phone] hm [phone]  Permission to leave phone message Yes Yes  Some recent data might be hidden     Patient questions:  Message left to call us if necessary.

## 2019-02-05 ENCOUNTER — Other Ambulatory Visit: Payer: Self-pay

## 2019-02-05 ENCOUNTER — Ambulatory Visit
Admission: RE | Admit: 2019-02-05 | Discharge: 2019-02-05 | Disposition: A | Discharge status: Home / Self Care | Payer: Medicare HMO | Source: Ambulatory Visit | Attending: Nurse Practitioner | Admitting: Nurse Practitioner

## 2019-02-05 DIAGNOSIS — Z1231 Encounter for screening mammogram for malignant neoplasm of breast: Secondary | ICD-10-CM

## 2019-02-05 DIAGNOSIS — M8580 Other specified disorders of bone density and structure, unspecified site: Secondary | ICD-10-CM

## 2019-02-10 ENCOUNTER — Encounter: Payer: Self-pay | Admitting: Gastroenterology

## 2019-07-13 ENCOUNTER — Emergency Department (HOSPITAL_COMMUNITY): Payer: Medicare HMO

## 2019-07-13 ENCOUNTER — Emergency Department (HOSPITAL_COMMUNITY)
Admission: EM | Admit: 2019-07-13 | Discharge: 2019-07-13 | Disposition: A | Discharge status: Home / Self Care | Payer: Medicare HMO | Attending: Emergency Medicine | Admitting: Emergency Medicine

## 2019-07-13 ENCOUNTER — Other Ambulatory Visit: Payer: Self-pay

## 2019-07-13 ENCOUNTER — Encounter (HOSPITAL_COMMUNITY): Payer: Self-pay

## 2019-07-13 DIAGNOSIS — Y92009 Unspecified place in unspecified non-institutional (private) residence as the place of occurrence of the external cause: Secondary | ICD-10-CM

## 2019-07-13 DIAGNOSIS — S79911A Unspecified injury of right hip, initial encounter: Secondary | ICD-10-CM | POA: Diagnosis present

## 2019-07-13 DIAGNOSIS — Z79899 Other long term (current) drug therapy: Secondary | ICD-10-CM | POA: Insufficient documentation

## 2019-07-13 DIAGNOSIS — Y999 Unspecified external cause status: Secondary | ICD-10-CM | POA: Insufficient documentation

## 2019-07-13 DIAGNOSIS — Y9389 Activity, other specified: Secondary | ICD-10-CM | POA: Insufficient documentation

## 2019-07-13 DIAGNOSIS — E119 Type 2 diabetes mellitus without complications: Secondary | ICD-10-CM | POA: Diagnosis not present

## 2019-07-13 DIAGNOSIS — Z7984 Long term (current) use of oral hypoglycemic drugs: Secondary | ICD-10-CM | POA: Diagnosis not present

## 2019-07-13 DIAGNOSIS — W19XXXA Unspecified fall, initial encounter: Secondary | ICD-10-CM

## 2019-07-13 DIAGNOSIS — E033 Postinfectious hypothyroidism: Secondary | ICD-10-CM | POA: Insufficient documentation

## 2019-07-13 DIAGNOSIS — M25551 Pain in right hip: Secondary | ICD-10-CM | POA: Insufficient documentation

## 2019-07-13 DIAGNOSIS — I10 Essential (primary) hypertension: Secondary | ICD-10-CM | POA: Diagnosis not present

## 2019-07-13 DIAGNOSIS — Y92008 Other place in unspecified non-institutional (private) residence as the place of occurrence of the external cause: Secondary | ICD-10-CM | POA: Diagnosis not present

## 2019-07-13 DIAGNOSIS — Z7982 Long term (current) use of aspirin: Secondary | ICD-10-CM | POA: Diagnosis not present

## 2019-07-13 DIAGNOSIS — W108XXA Fall (on) (from) other stairs and steps, initial encounter: Secondary | ICD-10-CM | POA: Insufficient documentation

## 2019-07-13 MED ORDER — HYDROCODONE-ACETAMINOPHEN 5-325 MG PO TABS
1.0000 | ORAL_TABLET | Freq: Once | ORAL | Status: AC
  Administered 2019-07-13: 1 via ORAL
  Filled 2019-07-13: qty 1

## 2019-07-13 NOTE — ED Triage Notes (Signed)
Pt reports mechanical fall this morning, c.o right hip pain, pt arrives ambulatory with crutches, denies blood thinners or hitting her head.

## 2019-07-13 NOTE — Discharge Instructions (Addendum)
1. Medications: You can take 1 to 2 tablets of Tylenol (350mg -1000mg  depending on the dose) every 6 hours as needed for pain.  Do not exceed 4000 mg of Tylenol daily.  If your pain persists you can take a dose of ibuprofen in between doses of Tylenol.  I usually recommend 400 of ibuprofen every 6 hours.  Take this with food to avoid upset stomach issues. 2. Treatment: rest, ice, elevate and use crutches or walker to help you get around, drink plenty of fluids, gentle stretching (see attached exercises) 3. Follow Up: Please followup with orthopedics as directed or your PCP in 1 week if no improvement for discussion of your diagnoses and further evaluation after today's visit; if you do not have a primary care doctor use the resource guide provided to find one; Please return to the ER for worsening symptoms or other concerns such as worsening swelling, redness of the skin, fevers, loss of pulses, or loss of feeling

## 2019-07-13 NOTE — ED Provider Notes (Signed)
[facility] EMERGENCY DEPARTMENT Provider Note   CSN: [id number] Arrival date & time: 07/13/19  [id number]     History Chief Complaint  Patient presents with  . Fall  . Hip Pain    Anna Coleman is a 69 y.o. female with history of hyperlipidemia, hypertension, hypothyroidism, nephrolithiasis, type 2 diabetes mellitus presents for evaluation of acute onset, constant right hip pain secondary to mechanical fall that occurred just prior to arrival.  She reports that she was walking down a flight of steps in her home which has no power so it was dark.  She states that she "missed the second to last step" and fell landing on her buttocks.  Denies head injury or loss of consciousness.  No prodrome leading up to the fall.  She reports constant aching pain to the lateral aspect of the right hip which does not radiate and worsens with any movement, especially internal rotation.  She has been ambulating with the aid of crutches has been able to apply some weight to the extremity.  Denies numbness to the right lower extremity.  She denies headaches, vision changes, nausea, vomiting, low back pain, abdominal pain, chest pain or shortness of breath.  She is not on any blood thinners.  No medicines for pain prior to arrival.  The history is provided by the patient.       Past Medical History:  Diagnosis Date  . Allergy   . Arthritis    "left shoulder; knees" (06/17/2015)  . Cataract   . Depression   . Diverticulitis   . Hashimoto's disease   . Hyperlipidemia   . Hypertension   . Hypothyroidism   . Kidney stones   . Pneumonia 1960s X 1  . Thyroid disease   . Type II diabetes mellitus [facility])     Patient Active Problem List   Diagnosis Date Noted  . Chest pain 06/17/2015  . Type 2 diabetes mellitus without complication, without long-term current use of insulin ([place]) 06/17/2015  . Essential hypertension 06/17/2015  . Hypothyroidism due to Hashimoto's thyroiditis 06/17/2015  .  Depression 06/17/2015    Past Surgical History:  Procedure Laterality Date  . CARPAL TUNNEL RELEASE Right   . CESAREAN SECTION  1977; 1979  . CHOLECYSTECTOMY OPEN  1980s  . COLON SURGERY  ~ 2010   "took 10i nches of my bowel out for diverticulitis"  . COLONOSCOPY  07/05/2018  . COLOSTOMY  ~ 2010  . COLOSTOMY CLOSURE  ~ 2010   "closed it 6 weeks after they put it in"  . CYST EXCISION  [id number]   "couple on my head; one on my butt, left forearm"  . diverticulitis    . GYNECOLOGIC CRYOSURGERY  ~ 1983   "froze my cervix"  . POLYPECTOMY    . TUBAL LIGATION  1979     OB History   No obstetric history on file.     Family History  Problem Relation Age of Onset  . Heart failure Mother   . Diabetes Mother   . Diabetes Brother   . Stroke Brother   . Colon cancer Neg Hx   . Colon polyps Neg Hx   . Esophageal cancer Neg Hx   . Rectal cancer Neg Hx   . Stomach cancer Neg Hx     Social History   Tobacco Use  . Smoking status: Never Smoker  . Smokeless tobacco: Never Used  Substance Use Topics  . Alcohol use: No  . Drug use: No  Home Medications Prior to Admission medications   Medication Sig Start Date End Date Taking? Authorizing Provider  aspirin EC 81 MG tablet Take 81 mg by mouth daily.   Yes [provider]  Calcium Carb-Cholecalciferol (CALCIUM 600 + D PO) Take 1 tablet by mouth 2 (two) times daily.   Yes [provider]  carvedilol (COREG) 6.25 MG tablet Take 6.25 mg by mouth 2 (two) times daily with a meal.   Yes [provider]  CINNAMON PO Take 1 tablet by mouth daily.   Yes [provider]  citalopram (CELEXA) 20 MG tablet Take 20 mg by mouth daily.   Yes [provider]  diclofenac (VOLTAREN) 75 MG EC tablet Take 75 mg by mouth 2 (two) times daily.   Yes [provider]  glipiZIDE (GLUCOTROL) 10 MG tablet Take 10 mg by mouth 2 (two) times daily before a meal.    Yes [provider]  Glucose  Blood (BLOOD GLUCOSE TEST STRIPS) STRP daily.    Yes [provider]  levothyroxine (SYNTHROID, LEVOTHROID) 100 MCG tablet Take 100 mcg by mouth daily.    Yes [provider]  loperamide (IMODIUM A-D) 2 MG tablet Take 4 mg by mouth See admin instructions. Take 2 tablets (4 mg) by mouth at onset of diarrhea/loose stools, may take a 2nd dose of 1 tablet (2 mg) if still needed   Yes [provider]  lovastatin (MEVACOR) 40 MG tablet Take 40 mg by mouth at bedtime.  02/20/17  Yes [provider]  metFORMIN (GLUCOPHAGE) 1000 MG tablet Take 1,000 mg by mouth 2 (two) times daily with a meal.   Yes [provider]  pioglitazone (ACTOS) 30 MG tablet Take 30 mg by mouth daily.  11/13/18  Yes [provider]  neomycin-polymyxin-hydrocortisone (CORTISPORIN) 3.5-10000-1 otic suspension Place 4 drops into the right ear 4 (four) times daily. Patient not taking: Reported on 07/13/2019 11/30/13   Billy Fischer, MD    Allergies    Ace inhibitors, Erythromycin, Ampicillin, and Penicillins  Review of Systems   Review of Systems  Constitutional: Negative for fever.  Respiratory: Negative for shortness of breath.   Cardiovascular: Negative for chest pain.  Gastrointestinal: Negative for abdominal pain, nausea and vomiting.  Musculoskeletal: Positive for arthralgias.  Neurological: Negative for syncope, weakness, light-headedness and numbness.  All other systems reviewed and are negative.   Physical Exam Updated Vital Signs BP (!) 167/67   Pulse (!) 55   Temp 99.9 F (37.7 C) (Oral)   Resp 16   Ht 5\' 6"  (1.676 m)   Wt 89.4 kg   SpO2 100%   BMI 31.80 kg/m   Physical Exam Vitals and nursing note reviewed.  Constitutional:      General: She is not in acute distress.    Appearance: She is well-developed.  HENT:     Head: Normocephalic and atraumatic.     Comments: No Battle's signs, no raccoon's eyes, no rhinorrhea. No hemotympanum. No tenderness to  palpation of the face or skull. No deformity, crepitus, or swelling noted.   Eyes:     General:        Right eye: No discharge.        Left eye: No discharge.     Extraocular Movements: Extraocular movements intact.     Conjunctiva/sclera: Conjunctivae normal.     Pupils: Pupils are equal, round, and reactive to light.  Neck:     Vascular: No JVD.  Trachea: No tracheal deviation.     Comments: No midline cervical spine tenderness.  No deformity, crepitus or step-off noted Cardiovascular:     Rate and Rhythm: Normal rate.     Pulses: Normal pulses.  Pulmonary:     Effort: Pulmonary effort is normal.  Chest:     Chest wall: No tenderness.  Abdominal:     General: Bowel sounds are normal. There is no distension.     Palpations: Abdomen is soft.     Tenderness: There is no abdominal tenderness. There is no guarding.  Musculoskeletal:        General: Tenderness present.     Cervical back: Normal range of motion and neck supple.     Comments: Tenderness to palpation of right lateral hip and right SI joint.  No deformity, crepitus, ecchymosis.  Pain with flexion and internal rotation.  5/5 strength of BUE and BLE major muscle groups.  No midline spine tenderness.  Skin:    General: Skin is warm and dry.     Capillary Refill: Capillary refill takes less than 2 seconds.     Findings: No erythema.  Neurological:     General: No focal deficit present.     Mental Status: She is alert and oriented to person, place, and time.     Cranial Nerves: No cranial nerve deficit.     Sensory: No sensory deficit.     Motor: No weakness.     Comments: Mental Status:  Alert, thought content appropriate, able to give a coherent history. Speech fluent without evidence of aphasia. Able to follow 2 step commands without difficulty.  Cranial Nerves:  II:  Peripheral visual fields grossly normal, pupils equal, round, reactive to light III,IV, VI: ptosis not present, extra-ocular motions intact  bilaterally  V,VII: smile symmetric, facial light touch sensation equal VIII: hearing grossly normal to voice  X: uvula elevates symmetrically  XI: bilateral shoulder shrug symmetric and strong XII: midline tongue extension without fassiculations Motor:  Normal tone. 5/5 strength of BUE and BLE major muscle groups including strong and equal grip strength and dorsiflexion/plantar flexion Sensory: light touch normal in all extremities. Gait: Ambulatory with antalgic gait, significant difficulty bearing weight on the right lower extremity   Psychiatric:        Behavior: Behavior normal.     ED Results / Procedures / Treatments   Labs (all labs ordered are listed, but only abnormal results are displayed) Labs Reviewed - No data to display  EKG None  Radiology CT Hip Right Wo Contrast  Result Date: 07/13/2019 CLINICAL DATA:  Hip pain after trauma EXAM: CT OF THE RIGHT HIP WITHOUT CONTRAST TECHNIQUE: Multidetector CT imaging of the right hip was performed according to the standard protocol. Multiplanar CT image reconstructions were also generated. COMPARISON:  Radiographs from 07/13/2019 FINDINGS: Bones/Joint/Cartilage No visible fracture. Axial an craniocaudad loss of articular space in the right hip joint compatible with degenerative chondral thinning. Mild spurring of the acetabulum and greater trochanter. Right facet and intervertebral spurring at L5-S1 but only with borderline foraminal impingement. No hip joint effusion identified. Ligaments Suboptimally assessed by CT. Muscles and Tendons Unremarkable Soft tissues Aortoiliac atherosclerotic vascular disease. Potential mild trochanteric bursitis on the right, with indistinctness of tissue planes immediately adjacent to the greater trochanter. IMPRESSION: 1. No fracture or dislocation identified. 2. 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 3. Potential mild trochanteric bursitis on the right, with indistinctness of tissue  planes immediately adjacent to the greater trochanter.  4. Aortoiliac atherosclerotic vascular disease. 5. Right facet and intervertebral spurring at L5-S1 but only with borderline right foraminal impingement. Electronically Signed   By: Van Clines M.D.   On: 07/13/2019 10:30   DG Hip Unilat  With Pelvis 2-3 Views Right  Result Date: 07/13/2019 CLINICAL DATA:  Recent fall with right-sided hip pain, initial encounter EXAM: DG HIP (WITH OR WITHOUT PELVIS) 3V RIGHT COMPARISON:  None. FINDINGS: Pelvic ring is intact. No acute fracture or dislocation is noted. No soft tissue abnormality is seen. IMPRESSION: No acute abnormality noted. Electronically Signed   By: Inez Catalina M.D.   On: 07/13/2019 09:41    Procedures Procedures (including critical care time)  Medications Ordered in ED Medications  HYDROcodone-acetaminophen (NORCO/VICODIN) 5-325 MG per tablet 1 tablet (1 tablet Oral Given 07/13/19 0954)    ED Course  I have reviewed the triage vital signs and the nursing notes.  Pertinent labs & imaging results that were available during my care of the patient were reviewed by me and considered in my medical decision making (see chart for details).    MDM Rules/Calculators/A&P                      Patient presenting for evaluation of right hip pain secondary to mechanical fall just prior to arrival.  She is afebrile, intermittently hypertensive though I suspect her pain is likely driving this.  She is nontoxic in appearance.  Compartments are soft, no evidence of secondary skin infection.  She is neurovascularly intact.  No midline spine tenderness.  No signs of serious head injury and she did not hit her head or lose consciousness.  No prodrome leading up to the fall.  She appears well-hydrated.  No signs of serious injury to the chest or abdomen/pelvis.  She is able to actively and passively range the right hip but has a considerable amount of pain bearing weight.  Plain films reviewed by  myself were negative with no evidence of acute osseous abnormalities so a CT was obtained which shows no evidence of fracture or dislocation.  She does have degenerative chondral thinning in the right hip joint and potential mild trochanteric bursitis.  On reevaluation she is resting comfortably in no apparent distress, reports that her pain has improved.  She does have crutches at the bedside and reports she has a walker at home.  She is able to ambulate steadily with the aid of the crutches.  Discussed conservative therapy and management with Tylenol, ice, gentle stretching, and continued use of assistive devices as needed.  Recommend follow-up with orthopedist for reevaluation on outpatient basis.  Discussed strict ED return precautions. Patient verbalized understanding of and agreement with plan and is safe for discharge home at this time.    Final Clinical Impression(s) / ED Diagnoses Final diagnoses:  Fall in home, initial encounter  Acute right hip pain    Rx / DC Orders ED Discharge Orders    None       Renita Papa, PA-C 07/13/19 1113    Sherwood Gambler, MD 07/13/19 1214

## 2020-11-10 IMAGING — CT CT ABD-PELV W/ CM
2 of 5 series · 16 of 46 positions shown, 18 images · IV contrast (omnipaque)
Comparison: 04/10/2013

CLINICAL DATA: Right upper quadrant abdominal pain. History of a
colostomy. Diarrhea yesterday x2, once today. Partial colectomy for
diverticulitis.

EXAM:
CT ABDOMEN AND PELVIS WITH CONTRAST
TECHNIQUE: Multidetector CT imaging of the abdomen and pelvis was performed
using the standard protocol following bolus administration of
intravenous contrast.
CONTRAST:  100mL OMNIPAQUE IOHEXOL 300 MG/ML  SOLN

[Series 3: abd/ pelvis 5.0 i30f 2 · axial · 0.87mm/px · z∈[+956,+1376]mm · 13 of 94 slices shown, 15 images]
[im 5/94  soft-tissue]
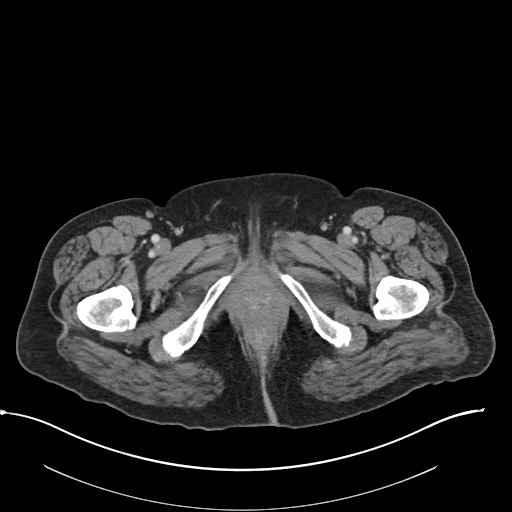
[im 5/94  bone]
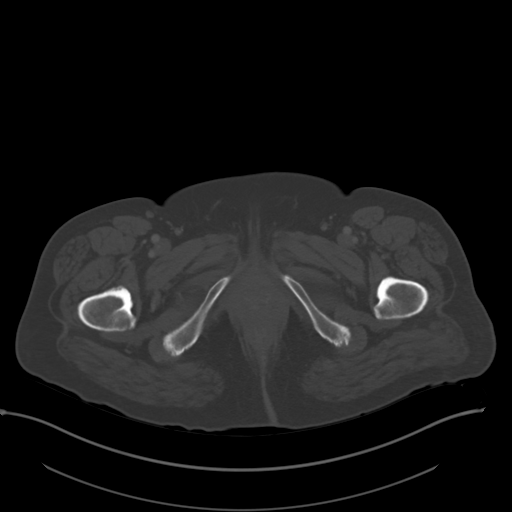
[im 15/94  soft-tissue]
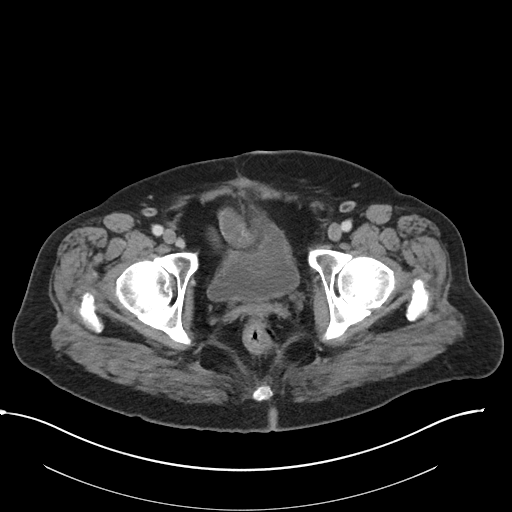
[im 20/94  soft-tissue]
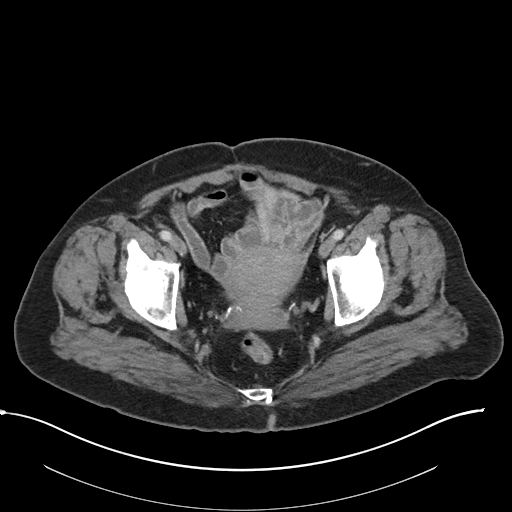
[im 25/94  soft-tissue]
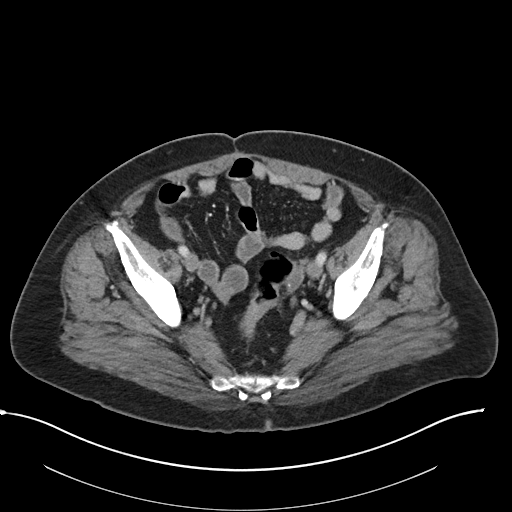
[im 35/94  soft-tissue]
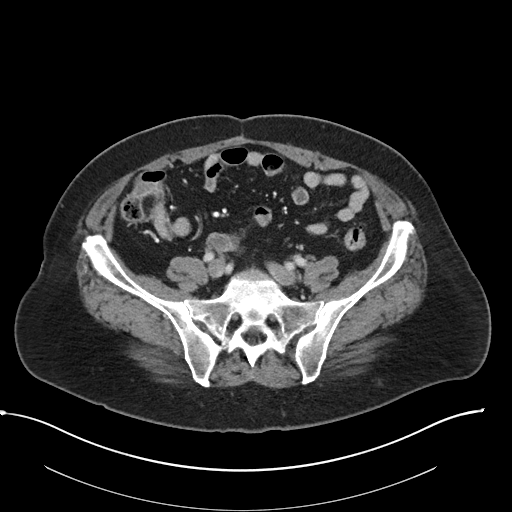
[im 40/94  soft-tissue]
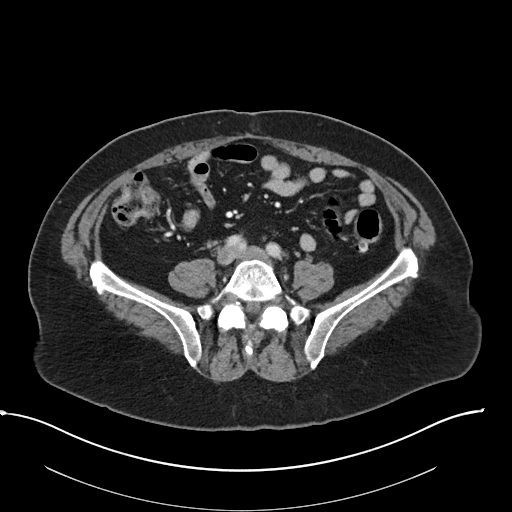
[im 49/94  soft-tissue]
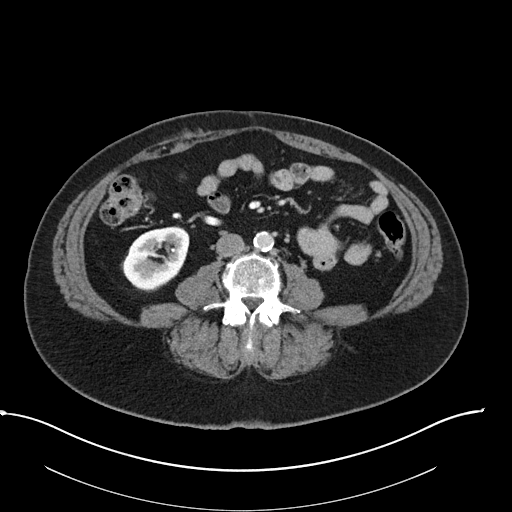
[im 54/94  soft-tissue]
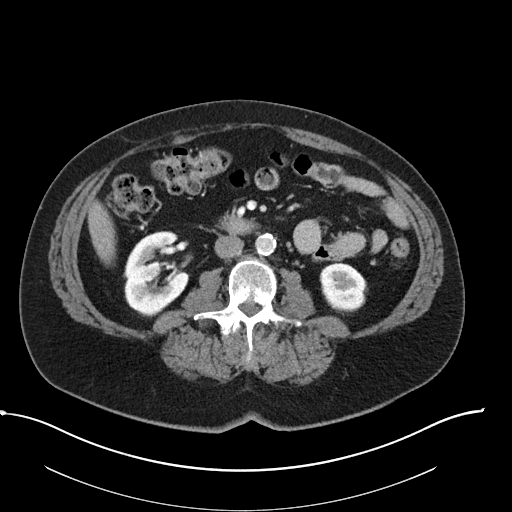
[im 59/94  soft-tissue]
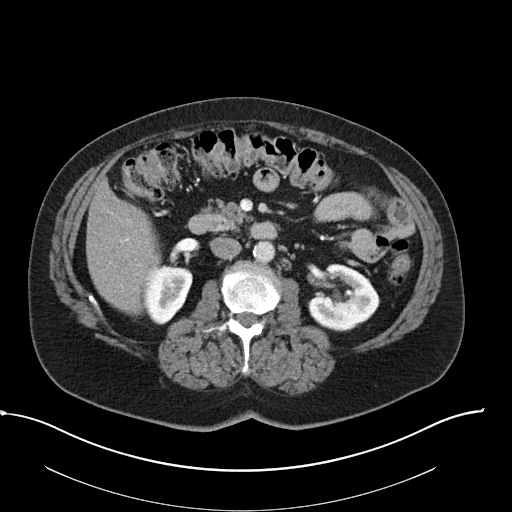
[im 59/94  bone]
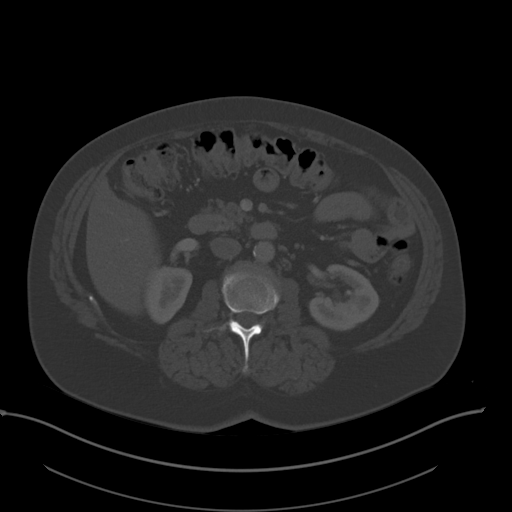
[im 69/94  soft-tissue]
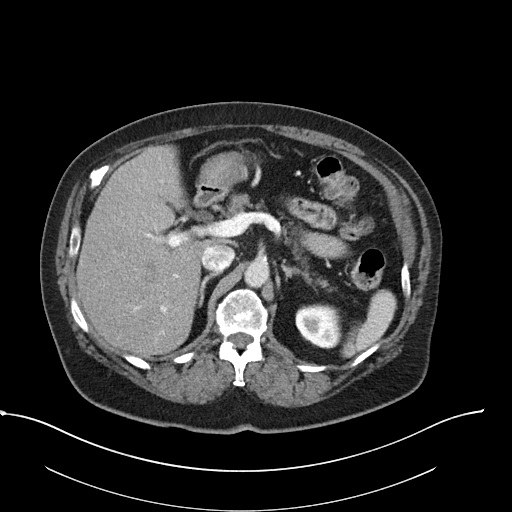
[im 74/94  soft-tissue]
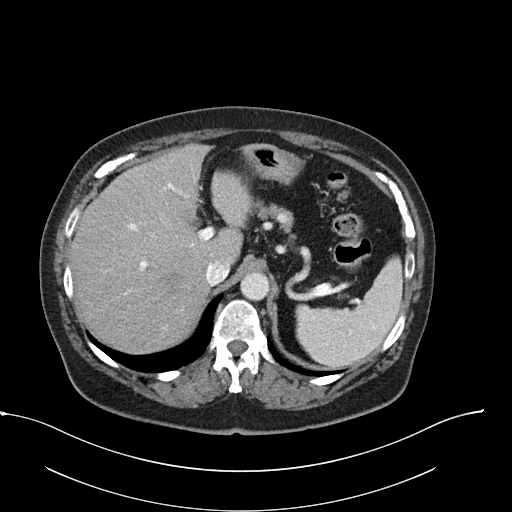
[im 79/94  soft-tissue]
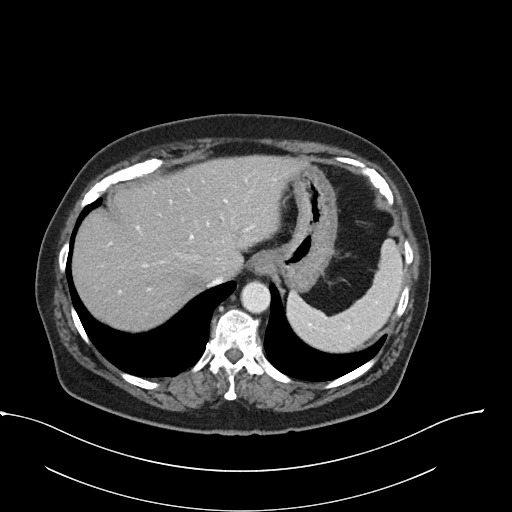
[im 89/94  soft-tissue]
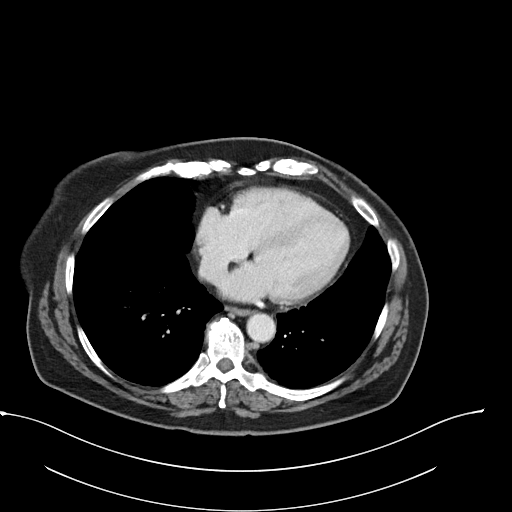

[Series 6: coronal soft tissue · coronal · 0.86mm/px · 3 of 108 slices shown]
[im 36/108  soft-tissue]
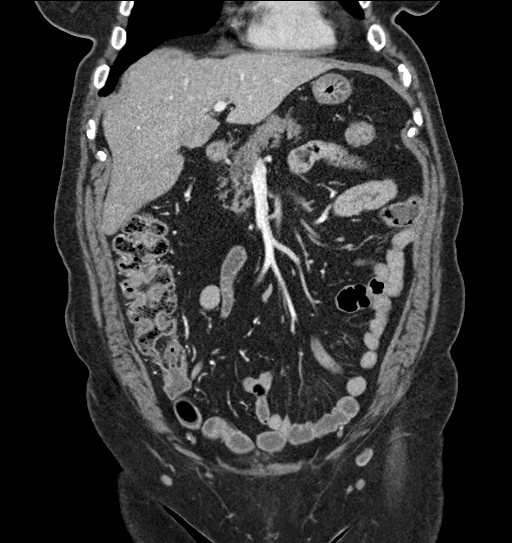
[im 48/108  soft-tissue]
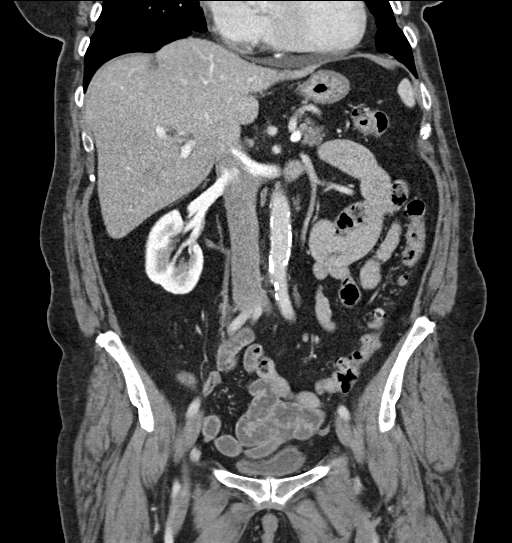
[im 60/108  soft-tissue]
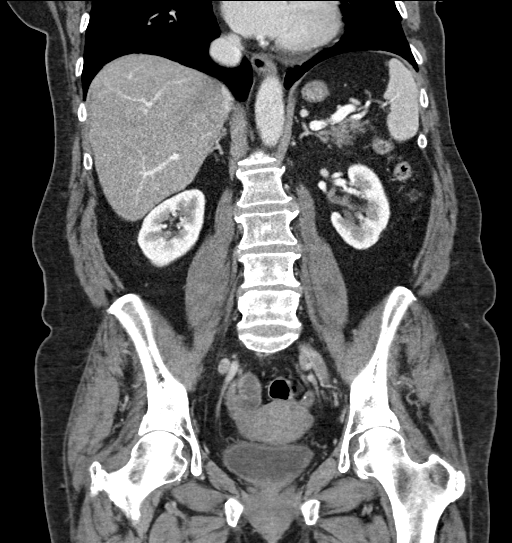

[16 of 46 positions shown; findings below may reference images not displayed]

FINDINGS: Lower chest: Clear lung bases.

Hepatobiliary: No focal liver abnormality is seen. Status post
cholecystectomy. No biliary dilatation.

Pancreas: Unremarkable. No pancreatic ductal dilatation or
surrounding inflammatory changes.

Spleen: Normal in size without focal abnormality.

Adrenals/Urinary Tract: No adrenal masses.

Kidneys normal size, orientation and position. 4 mm low-density
lesion, medial midpole of the left kidney, most likely a cyst. No
other renal masses or lesions, no stones and no hydronephrosis.
Ureters are normal course and in caliber. Bladder is unremarkable.

Stomach/Bowel: Normal stomach.

Small bowel is normal in caliber. No wall thickening or
inflammation. There is a small bowel anastomosis staple line in the
right lower quadrant. No adjacent inflammation.

There are scattered colonic diverticula along the left colon. No
evidence of diverticulitis. Colon is normal in caliber. No wall
thickening or other inflammatory process.

Normal appendix visualized.

Vascular/Lymphatic: Aortic atherosclerosis. No enlarged abdominal or
pelvic lymph nodes.

Reproductive: Uterus and bilateral adnexa are unremarkable.

Other: Scarring in the right lower quadrant deep subcutaneous soft
tissues reflecting the prior colostomy site. No abdominal wall
hernia. No ascites.

Musculoskeletal: No fracture or acute finding. No osteoblastic or
osteolytic lesions.
IMPRESSION: 1. No acute findings. No findings to account for right upper
quadrant pain.
2. There are residual left colon diverticula. No evidence of
diverticulitis.
3. Status post cholecystectomy.
4. Aortic atherosclerosis.

## 2022-03-17 ENCOUNTER — Encounter: Payer: Self-pay | Admitting: Gastroenterology

## 2022-03-21 ENCOUNTER — Ambulatory Visit: Payer: Medicare HMO | Admitting: Cardiology

## 2022-03-24 ENCOUNTER — Emergency Department (HOSPITAL_COMMUNITY)
Admission: EM | Admit: 2022-03-24 | Discharge: 2022-03-24 | Disposition: A | Discharge status: Home / Self Care | Payer: Medicare HMO | Attending: Student | Admitting: Student

## 2022-03-24 ENCOUNTER — Encounter (HOSPITAL_COMMUNITY): Payer: Self-pay | Admitting: Emergency Medicine

## 2022-03-24 ENCOUNTER — Emergency Department (HOSPITAL_COMMUNITY): Payer: Medicare HMO

## 2022-03-24 DIAGNOSIS — S76312A Strain of muscle, fascia and tendon of the posterior muscle group at thigh level, left thigh, initial encounter: Secondary | ICD-10-CM | POA: Insufficient documentation

## 2022-03-24 DIAGNOSIS — Y9301 Activity, walking, marching and hiking: Secondary | ICD-10-CM | POA: Diagnosis not present

## 2022-03-24 DIAGNOSIS — I10 Essential (primary) hypertension: Secondary | ICD-10-CM | POA: Insufficient documentation

## 2022-03-24 DIAGNOSIS — Z7982 Long term (current) use of aspirin: Secondary | ICD-10-CM | POA: Insufficient documentation

## 2022-03-24 DIAGNOSIS — E119 Type 2 diabetes mellitus without complications: Secondary | ICD-10-CM | POA: Insufficient documentation

## 2022-03-24 DIAGNOSIS — W010XXA Fall on same level from slipping, tripping and stumbling without subsequent striking against object, initial encounter: Secondary | ICD-10-CM | POA: Insufficient documentation

## 2022-03-24 DIAGNOSIS — M79605 Pain in left leg: Secondary | ICD-10-CM | POA: Diagnosis present

## 2022-03-24 DIAGNOSIS — Z7984 Long term (current) use of oral hypoglycemic drugs: Secondary | ICD-10-CM | POA: Diagnosis not present

## 2022-03-24 DIAGNOSIS — Z79899 Other long term (current) drug therapy: Secondary | ICD-10-CM | POA: Insufficient documentation

## 2022-03-24 DIAGNOSIS — E039 Hypothyroidism, unspecified: Secondary | ICD-10-CM | POA: Insufficient documentation

## 2022-03-24 LAB — CBG MONITORING, ED: Glucose-Capillary: 79 mg/dL (ref 70–99)

## 2022-03-24 MED ORDER — NAPROXEN 375 MG PO TABS: 375.0000 mg | ORAL_TABLET | Freq: Two times a day (BID) | ORAL | 0 refills | Status: DC

## 2022-03-24 MED ORDER — KETOROLAC TROMETHAMINE 15 MG/ML IJ SOLN
15.0000 mg | Freq: Once | INTRAMUSCULAR | Status: AC
  Administered 2022-03-24: 15 mg via INTRAVENOUS
  Filled 2022-03-24: qty 1

## 2022-03-24 NOTE — ED Provider Triage Note (Signed)
Emergency Medicine Provider Triage Evaluation Note  Anna Coleman , a 71 y.o. female  was evaluated in triage.  Pt complains of left hamstring pain after catching her husband who was falling. She denies any hip or knee pain.  She did not fall, strike her head or lose consciousness.  She states she felt a pop in the back of her hamstring.  Review of Systems  Positive: Left leg pain Negative: Fever  Physical Exam  BP (!) 165/54 (BP Location: Right Arm)   Pulse (!) 53   Temp 98.5 F (36.9 C) (Oral)   Resp 17   SpO2 99%  Gen:   Awake, no distress   Resp:  Normal effort  MSK:   Moves extremities without difficulty Other:  Able to flex at hip and knee. No bony TTP.   Medical Decision Making  Medically screening exam initiated at 2:20 PM.  Appropriate orders placed.  Anna Coleman was informed that the remainder of the evaluation will be completed by another provider, this initial triage assessment does not replace that evaluation, and the importance of remaining in the ED until their evaluation is complete.  Xrays - likely muscular injury.    Anna Coleman, [place] 03/24/22 1421

## 2022-03-24 NOTE — ED Provider Notes (Signed)
[facility] EMERGENCY DEPARTMENT Provider Note  CSN: [id number] Arrival date & time: 03/24/22 1342  Chief Complaint(s) Leg Injury  HPI Anna Coleman is a 71 y.o. female who presents emergency department for evaluation of a left leg injury.  Patient states that she was walking with her husband this afternoon when her husband tripped and she attempted to brace his fall with locked legs.  She heard a pop and had immediate pain to the left hamstring.  She is able to bear weight but with significant pain.  Denies numbness, tingling, weakness of the lower extremity.  Denies additional traumatic or systemic complaints.   Past Medical History Past Medical History:  Diagnosis Date   Allergy    Arthritis    "left shoulder; knees" (06/17/2015)   Cataract    Depression    Diverticulitis    Hashimoto's disease    Hyperlipidemia    Hypertension    Hypothyroidism    Kidney stones    Pneumonia 1960s X 1   Thyroid disease    Type II diabetes mellitus ([place])    Patient Active Problem List   Diagnosis Date Noted   Chest pain 06/17/2015   Type 2 diabetes mellitus without complication, without long-term current use of insulin ([place]) 06/17/2015   Essential hypertension 06/17/2015   Hypothyroidism due to Hashimoto's thyroiditis 06/17/2015   Depression 06/17/2015   Home Medication(s) Prior to Admission medications   Medication Sig Start Date End Date Taking? Authorizing Provider  aspirin EC 81 MG tablet Take 81 mg by mouth daily.    [provider]  Calcium Carb-Cholecalciferol (CALCIUM 600 + D PO) Take 1 tablet by mouth 2 (two) times daily.    [provider]  carvedilol (COREG) 6.25 MG tablet Take 6.25 mg by mouth 2 (two) times daily with a meal.    [provider]  CINNAMON PO Take 1 tablet by mouth daily.    [provider]  citalopram (CELEXA) 20 MG tablet Take 20 mg by mouth daily.    [provider]  diclofenac (VOLTAREN) 75 MG  EC tablet Take 75 mg by mouth 2 (two) times daily.    [provider]  glipiZIDE (GLUCOTROL) 10 MG tablet Take 10 mg by mouth 2 (two) times daily before a meal.     [provider]  Glucose Blood (BLOOD GLUCOSE TEST STRIPS) STRP daily.     [provider]  levothyroxine (SYNTHROID, LEVOTHROID) 100 MCG tablet Take 100 mcg by mouth daily.     [provider]  loperamide (IMODIUM A-D) 2 MG tablet Take 4 mg by mouth See admin instructions. Take 2 tablets (4 mg) by mouth at onset of diarrhea/loose stools, may take a 2nd dose of 1 tablet (2 mg) if still needed    [provider]  lovastatin (MEVACOR) 40 MG tablet Take 40 mg by mouth at bedtime.  02/20/17   [provider]  metFORMIN (GLUCOPHAGE) 1000 MG tablet Take 1,000 mg by mouth 2 (two) times daily with a meal.    [provider]  neomycin-polymyxin-hydrocortisone (CORTISPORIN) 3.5-10000-1 otic suspension Place 4 drops into the right ear 4 (four) times daily. Patient not taking: Reported on 07/13/2019 11/30/13   Billy Fischer, MD  pioglitazone (ACTOS) 30 MG tablet Take 30 mg by mouth daily.  11/13/18   [provider]  Past Surgical History Past Surgical History:  Procedure Laterality Date   CARPAL TUNNEL RELEASE Right    CESAREAN SECTION  1977; [place]  ~ 2010   "took 10i nches of my bowel out for diverticulitis"   COLONOSCOPY  07/05/2018   COLOSTOMY  ~ 2010   COLOSTOMY CLOSURE  ~ 2010   "closed it 6 weeks after they put it in"   CYST EXCISION  [phone]   "couple on my head; one on my butt, left forearm"   diverticulitis     GYNECOLOGIC CRYOSURGERY  ~ 1983   "froze my cervix"   POLYPECTOMY     TUBAL LIGATION  1979   Family History Family History  Problem Relation Age of Onset   Heart failure  Mother    Diabetes Mother    Diabetes Brother    Stroke Brother    Colon cancer Neg Hx    Colon polyps Neg Hx    Esophageal cancer Neg Hx    Rectal cancer Neg Hx    Stomach cancer Neg Hx     Social History Social History   Tobacco Use   Smoking status: Never   Smokeless tobacco: Never  Vaping Use   Vaping Use: Never used  Substance Use Topics   Alcohol use: No   Drug use: No   Allergies Ace inhibitors, Erythromycin, Ampicillin, and Penicillins  Review of Systems Review of Systems  Musculoskeletal:  Positive for arthralgias.    Physical Exam Vital Signs  I have reviewed the triage vital signs BP (!) 165/54 (BP Location: Right Arm)   Pulse (!) 53   Temp 98.5 F (36.9 C) (Oral)   Resp 17   SpO2 99%   Physical Exam Vitals and nursing note reviewed.  Constitutional:      General: She is not in acute distress.    Appearance: She is well-developed.  HENT:     Head: Normocephalic and atraumatic.  Eyes:     Conjunctiva/sclera: Conjunctivae normal.  Cardiovascular:     Rate and Rhythm: Normal rate and regular rhythm.     Heart sounds: No murmur heard. Pulmonary:     Effort: Pulmonary effort is normal. No respiratory distress.     Breath sounds: Normal breath sounds.  Abdominal:     Palpations: Abdomen is soft.     Tenderness: There is no abdominal tenderness.  Musculoskeletal:        General: Tenderness present. No swelling.     Cervical back: Neck supple.  Skin:    General: Skin is warm and dry.     Capillary Refill: Capillary refill takes less than 2 seconds.  Neurological:     Mental Status: She is alert.  Psychiatric:        Mood and Affect: Mood normal.     ED Results and Treatments Labs (all labs ordered are listed, but only abnormal results are displayed) Labs Reviewed  CBG MONITORING, ED  Radiology DG Knee 2 Views  Left  Result Date: 03/24/2022 CLINICAL DATA:  Left hip pain radiating to left knee, injury EXAM: LEFT KNEE - 1-2 VIEW COMPARISON:  None Available. FINDINGS: Frontal and lateral views of the left knee are obtained. No fracture, subluxation, or dislocation. Joint spaces are well preserved. No joint effusion. Soft tissues are unremarkable. IMPRESSION: 1. Unremarkable left knee. Electronically Signed   By: Randa Ngo M.D.   On: 03/24/2022 14:57   DG Hip Unilat W or Wo Pelvis 2-3 Views Left  Result Date: 03/24/2022 CLINICAL DATA:  Left lateral hip pain radiating to left knee EXAM: DG HIP (WITH OR WITHOUT PELVIS) 2-3V LEFT COMPARISON:  None Available. FINDINGS: Frontal view of the pelvis as well as a frontal and frogleg lateral view of the left hip are obtained. No fracture, subluxation, or dislocation. Mild symmetrical bilateral hip osteoarthritis. Mild lower lumbar degenerative change. Sacroiliac joints are normal. IMPRESSION: 1. No acute displaced fracture. 2. Mild symmetrical bilateral hip osteoarthritis. Electronically Signed   By: Randa Ngo M.D.   On: 03/24/2022 14:57    Pertinent labs & imaging results that were available during my care of the patient were reviewed by me and considered in my medical decision making (see MDM for details).  Medications Ordered in ED Medications  ketorolac (TORADOL) 15 MG/ML injection 15 mg (15 mg Intravenous Given 03/24/22 2042)                                                                                                                                     Procedures Procedures  (including critical care time)  Medical Decision Making / ED Course   This patient presents to the ED for concern of leg pain, this involves an extensive number of treatment options, and is a complaint that carries with it a high risk of complications and morbidity.  The differential diagnosis includes hamstring strain, fracture, ligamentous injury  MDM: Seen in the  emergency department for evaluation of leg pain.  Physical exam reveals tenderness along the distribution of the hamstring on the left.  X-ray imaging reassuringly negative.  Patient given Toradol and an Ace wrap and was provided crutches with instructions to weight-bear as tolerated.  She was given resources to follow-up with orthopedics and was discharged with outpatient follow-up.   Additional history obtained: -Additional history obtained from daughter -External records from outside source obtained and reviewed including: Chart review including previous notes, labs, imaging, consultation notes   Lab Tests: -I ordered, reviewed, and interpreted labs.   The pertinent results include:   Labs Reviewed  CBG MONITORING, ED     Imaging Studies ordered: I ordered imaging studies including x-ray hip, x-ray knee I independently visualized and interpreted imaging. I agree with the radiologist interpretation   Medicines ordered and prescription drug management: Meds ordered this encounter  Medications   ketorolac (TORADOL) 15 MG/ML injection 15 mg    -  I have reviewed the patients home medicines and have made adjustments as needed  Critical interventions none    Cardiac Monitoring: The patient was maintained on a cardiac monitor.  I personally viewed and interpreted the cardiac monitored which showed an underlying rhythm of: NSR  Social Determinants of Health:  Factors impacting patients care include: none   Reevaluation: After the interventions noted above, I reevaluated the patient and found that they have :improved  Co morbidities that complicate the patient evaluation  Past Medical History:  Diagnosis Date   Allergy    Arthritis    "left shoulder; knees" (06/17/2015)   Cataract    Depression    Diverticulitis    Hashimoto's disease    Hyperlipidemia    Hypertension    Hypothyroidism    Kidney stones    Pneumonia 1960s X 1   Thyroid disease    Type II diabetes  mellitus ([place])       Dispostion: I considered admission for this patient, but she currently does not meet inpatient criteria for admission and is safe for discharge with outpatient follow-up     Final Clinical Impression(s) / ED Diagnoses Final diagnoses:  None     '@PCDICTATION'$ @    Teressa Lower, MD 03/24/22 2119

## 2022-03-24 NOTE — ED Triage Notes (Signed)
Patient BIB [facility] from home after she felt a "pop" in her left thigh that occurred when she caught her faling husband earlier today. Patient is alert, oriented, and in no apparent distress at this time.m  BP 148/77 HR 60 99% on room air CBG 167 20g saline lock in left [facility]

## 2022-04-05 ENCOUNTER — Ambulatory Visit: Payer: Medicare HMO | Admitting: Internal Medicine

## 2022-04-17 ENCOUNTER — Ambulatory Visit: Payer: Medicare HMO | Admitting: Internal Medicine

## 2022-04-17 ENCOUNTER — Encounter: Payer: Self-pay | Admitting: Internal Medicine

## 2022-04-17 VITALS — BP 160/71 | HR 59 | Ht 66.0 in | Wt 164.2 lb

## 2022-04-17 DIAGNOSIS — I10 Essential (primary) hypertension: Secondary | ICD-10-CM

## 2022-04-17 DIAGNOSIS — E119 Type 2 diabetes mellitus without complications: Secondary | ICD-10-CM

## 2022-04-17 DIAGNOSIS — R0609 Other forms of dyspnea: Secondary | ICD-10-CM | POA: Insufficient documentation

## 2022-04-17 MED ORDER — AMLODIPINE BESYLATE 10 MG PO TABS: 10.0000 mg | ORAL_TABLET | Freq: Every day | ORAL | 3 refills | Status: AC

## 2022-04-17 NOTE — Progress Notes (Unsigned)
Primary Physician/Referring:  Garnet Sierras, NP  Patient ID: Anna Coleman, female    DOB: [birth year], 71 y.o.   MRN: [id number]  Chief Complaint  Patient presents with   Shortness of Breath   Hypertension   New Patient (Initial Visit)        HPI:    Anna Coleman  is a 71 y.o. female with past medical history significant for hypertension, hyperlipidemia, and diabetes who is here to establish care with cardiology.  Patient has been having dyspnea on exertion with some chest tightness.  Previously she was able to do all of her activities without being limited by her breathing.  This is new for her and she has not had anything like this in the past.  Patient did have a stress test and echocardiogram almost 7 years ago which came back normal.  She has not had any cardiology work-up since.  Patient denies palpitations, diaphoresis, syncope, edema, orthopnea, PND, claudication.  Past Medical History:  Diagnosis Date   Allergy    Arthritis    "left shoulder; knees" (06/17/2015)   Cataract    Depression    Diverticulitis    Hashimoto's disease    Hyperlipidemia    Hypertension    Hypothyroidism    Kidney stones    Pneumonia 1960s X 1   Thyroid disease    Type II diabetes mellitus ([place])    Past Surgical History:  Procedure Laterality Date   CARPAL TUNNEL RELEASE Right    CESAREAN SECTION  1977; [place]  ~ 2010   "took 10i nches of my bowel out for diverticulitis"   COLONOSCOPY  07/05/2018   COLOSTOMY  ~ 2010   COLOSTOMY CLOSURE  ~ 2010   "closed it 6 weeks after they put it in"   CYST EXCISION  [phone]   "couple on my head; one on my butt, left forearm"   diverticulitis     GYNECOLOGIC CRYOSURGERY  ~ 1983   "froze my cervix"   POLYPECTOMY     TUBAL LIGATION  1979   Family History  Problem Relation Age of Onset   Heart failure Mother    Diabetes Mother    Diabetes Brother    Stroke Brother    Colon cancer Neg Hx     Colon polyps Neg Hx    Esophageal cancer Neg Hx    Rectal cancer Neg Hx    Stomach cancer Neg Hx     Social History   Tobacco Use   Smoking status: Never   Smokeless tobacco: Never  Substance Use Topics   Alcohol use: No   Marital Status: Married  ROS  Review of Systems  Cardiovascular:  Positive for chest pain and dyspnea on exertion.   Objective  Blood pressure (!) 160/71, pulse (!) 59, height '5\' 6"'$  (1.676 m), weight 164 lb 3.2 oz (74.5 kg), SpO2 99 %. Body mass index is 26.5 kg/m.     04/17/2022    1:01 PM 04/17/2022   12:55 PM 03/24/2022    9:03 PM  Vitals with BMI  Height  '5\' 6"'$    Weight  164 lbs 3 oz   BMI  97.35   Systolic 329 924 268  Diastolic 71 90 68  Pulse 59 63 58     Physical Exam Vitals reviewed.  HENT:     Head: Normocephalic and atraumatic.  Cardiovascular:     Rate and Rhythm: Normal rate and regular rhythm.  Pulses: Normal pulses.     Heart sounds: Normal heart sounds. No murmur heard. Pulmonary:     Effort: Pulmonary effort is normal.     Breath sounds: Normal breath sounds.  Abdominal:     General: Bowel sounds are normal.  Musculoskeletal:     Right lower leg: No edema.     Left lower leg: No edema.  Skin:    General: Skin is warm and dry.  Neurological:     Mental Status: She is alert.     Medications and allergies   Allergies  Allergen Reactions   Iodine Other (See Comments)   Lansoprazole Other (See Comments)   Naproxen Other (See Comments)   Ace Inhibitors Cough   Erythromycin Nausea And Vomiting   Ampicillin Rash   Penicillins Rash    Did it involve swelling of the face/tongue/throat, SOB, or low BP? No Did it involve sudden or severe rash/hives, skin peeling, or any reaction on the inside of your mouth or nose? Yes Did you need to seek medical attention at a hospital or doctor's office? Yes When did it last happen?     1974 If all above answers are "NO", may proceed with cephalosporin use.     Medication list  after today's encounter   Current Outpatient Medications:    amLODipine (NORVASC) 10 MG tablet, Take 1 tablet (10 mg total) by mouth daily., Disp: 90 tablet, Rfl: 3   aspirin EC 81 MG tablet, Take 81 mg by mouth daily., Disp: , Rfl:    Calcium Carb-Cholecalciferol (CALCIUM 600 + D PO), Take 1 tablet by mouth 2 (two) times daily., Disp: , Rfl:    carvedilol (COREG) 6.25 MG tablet, Take 6.25 mg by mouth 2 (two) times daily with a meal., Disp: , Rfl:    CINNAMON PO, Take 1 tablet by mouth daily., Disp: , Rfl:    diclofenac (VOLTAREN) 75 MG EC tablet, Take 75 mg by mouth 2 (two) times daily., Disp: , Rfl:    famotidine (PEPCID) 20 MG tablet, Take 20 mg by mouth 2 (two) times daily., Disp: , Rfl:    glipiZIDE (GLUCOTROL) 10 MG tablet, Take 10 mg by mouth 2 (two) times daily before a meal. , Disp: , Rfl:    Glucose Blood (BLOOD GLUCOSE TEST STRIPS) STRP, daily. , Disp: , Rfl:    levothyroxine (SYNTHROID, LEVOTHROID) 100 MCG tablet, Take 100 mcg by mouth daily. , Disp: , Rfl:    loperamide (IMODIUM A-D) 2 MG tablet, Take 4 mg by mouth See admin instructions. Take 2 tablets (4 mg) by mouth at onset of diarrhea/loose stools, may take a 2nd dose of 1 tablet (2 mg) if still needed, Disp: , Rfl:    losartan (COZAAR) 100 MG tablet, Take 100 mg by mouth daily., Disp: , Rfl:    lovastatin (MEVACOR) 40 MG tablet, Take 40 mg by mouth at bedtime. , Disp: , Rfl:    metFORMIN (GLUCOPHAGE) 1000 MG tablet, Take 1,000 mg by mouth 2 (two) times daily with a meal., Disp: , Rfl:    pioglitazone (ACTOS) 30 MG tablet, Take 30 mg by mouth daily. , Disp: , Rfl:    psyllium (REGULOID) 0.52 g capsule, Take 0.52 g by mouth daily., Disp: , Rfl:    citalopram (CELEXA) 20 MG tablet, Take 20 mg by mouth daily. (Patient not taking: Reported on 04/17/2022), Disp: , Rfl:   Laboratory examination:   Lab Results  Component Value Date   NA 138 06/30/2018  K 4.0 06/30/2018   CO2 22 06/30/2018   GLUCOSE 193 (H) 06/30/2018   BUN 8  06/30/2018   CREATININE 0.78 06/30/2018   CALCIUM 9.1 06/30/2018   GFRNONAA >60 06/30/2018       Latest Ref Rng & Units 06/30/2018    3:20 PM 06/17/2015    5:24 AM 06/16/2015   10:48 PM  CMP  Glucose 70 - 99 mg/dL 193   208   BUN 8 - 23 mg/dL 8   8   Creatinine 0.44 - 1.00 mg/dL 0.78  0.97  1.08   Sodium 135 - 145 mmol/L 138   138   Potassium 3.5 - 5.1 mmol/L 4.0   3.9   Chloride 98 - 111 mmol/L 103   100   CO2 22 - 32 mmol/L 22   26   Calcium 8.9 - 10.3 mg/dL 9.1   9.5   Total Protein 6.5 - 8.1 g/dL 6.8     Total Bilirubin 0.3 - 1.2 mg/dL 0.8     Alkaline Phos 38 - 126 U/L 56     AST 15 - 41 U/L 19     ALT 0 - 44 U/L 13         Latest Ref Rng & Units 06/30/2018    3:20 PM 06/17/2015    5:24 AM 06/16/2015   10:48 PM  CBC  WBC 4.0 - 10.5 K/uL 7.0  7.0  7.1   Hemoglobin 12.0 - 15.0 g/dL 12.3  12.0  12.6   Hematocrit 36.0 - 46.0 % 36.8  35.0  37.1   Platelets 150 - 400 K/uL 259  207  245     Lipid Panel No results for input(s): "CHOL", "TRIG", "LDLCALC", "VLDL", "HDL", "CHOLHDL", "LDLDIRECT" in the last 8760 hours.  HEMOGLOBIN A1C No results found for: "HGBA1C", "MPG" TSH No results for input(s): "TSH" in the last 8760 hours.  External labs:     Radiology:    Cardiac Studies:   05/2015 ECHO Study Conclusions  - Left ventricle: The cavity size was normal. Wall thickness was    normal. Systolic function was normal. The estimated ejection    fraction was in the range of 55% to 60%. Wall motion was normal;    there were no regional wall motion abnormalities. Doppler    parameters are consistent with abnormal left ventricular    relaxation (grade 1 diastolic dysfunction).     05/2015 nuclear stress test There was no ST segment deviation noted during stress. Defect 1: There is a small defect of mild severity present in the mid anteroseptal and mid inferoseptal location. The study is normal. This is a low risk study. Low risk stress nuclear study with a mild mid  septal fixed perfusion defect (most likely artifact) and normal left ventricular regional and global systolic function.  EKG:   04/17/2022: Sinus Rhythm -Poor R-wave progression -Negative precordial T-waves. Low voltage  Assessment     ICD-10-CM   1. Essential hypertension  I10 PCV ECHOCARDIOGRAM COMPLETE    PCV MYOCARDIAL PERFUSION WO LEXISCAN    2. DOE (dyspnea on exertion)  R06.09 EKG 12-Lead    PCV ECHOCARDIOGRAM COMPLETE    PCV MYOCARDIAL PERFUSION WO LEXISCAN    3. Type 2 diabetes mellitus without complication, without long-term current use of insulin (HCC)  E11.9 PCV ECHOCARDIOGRAM COMPLETE    PCV MYOCARDIAL PERFUSION WO LEXISCAN       Orders Placed This Encounter  Procedures   PCV MYOCARDIAL PERFUSION WO LEXISCAN  Standing Status:   Future    Standing Expiration Date:   06/17/2022   EKG 12-Lead   PCV ECHOCARDIOGRAM COMPLETE    Standing Status:   Future    Standing Expiration Date:   04/18/2023    Meds ordered this encounter  Medications   amLODipine (NORVASC) 10 MG tablet    Sig: Take 1 tablet (10 mg total) by mouth daily.    Dispense:  90 tablet    Refill:  3    Medications Discontinued During This Encounter  Medication Reason   naproxen (NAPROSYN) 375 MG tablet    neomycin-polymyxin-hydrocortisone (CORTISPORIN) 3.5-10000-1 otic suspension      Recommendations:   Anna Coleman is a 71 y.o.  female with DOE  DOE (dyspnea on exertion) Echocardiogram and stress test ordered   Essential hypertension Adding amlodipine '10mg'$  qday to current regiment Continue current cardiac medications. Encourage low-sodium diet, less than 2000 mg daily.   Type 2 diabetes mellitus without complication, without long-term current use of insulin (HCC) Continue glipizide, metformin, pioglitazone  Follow-up in 1-2 months or sooner if needed     Floydene Flock, DO, [facility]  04/18/2022, 12:57 PM Office: [phone] Pager: [phone]

## 2022-05-08 ENCOUNTER — Other Ambulatory Visit: Payer: Medicare HMO

## 2022-05-30 ENCOUNTER — Ambulatory Visit: Payer: Medicare HMO | Admitting: Internal Medicine

## 2022-06-26 ENCOUNTER — Other Ambulatory Visit: Payer: Medicare HMO

## 2022-07-03 ENCOUNTER — Ambulatory Visit: Payer: Medicare HMO | Admitting: Internal Medicine

## 2022-07-10 ENCOUNTER — Other Ambulatory Visit (HOSPITAL_BASED_OUTPATIENT_CLINIC_OR_DEPARTMENT_OTHER): Payer: Self-pay

## 2022-07-10 ENCOUNTER — Other Ambulatory Visit: Payer: Self-pay

## 2022-07-10 ENCOUNTER — Encounter (HOSPITAL_BASED_OUTPATIENT_CLINIC_OR_DEPARTMENT_OTHER): Payer: Self-pay | Admitting: Emergency Medicine

## 2022-07-10 ENCOUNTER — Emergency Department (HOSPITAL_BASED_OUTPATIENT_CLINIC_OR_DEPARTMENT_OTHER)
Admission: EM | Admit: 2022-07-10 | Discharge: 2022-07-10 | Disposition: A | Discharge status: Home / Self Care | Payer: Medicare HMO | Attending: Emergency Medicine | Admitting: Emergency Medicine

## 2022-07-10 DIAGNOSIS — Z79899 Other long term (current) drug therapy: Secondary | ICD-10-CM | POA: Insufficient documentation

## 2022-07-10 DIAGNOSIS — Z7984 Long term (current) use of oral hypoglycemic drugs: Secondary | ICD-10-CM | POA: Diagnosis not present

## 2022-07-10 DIAGNOSIS — Z7982 Long term (current) use of aspirin: Secondary | ICD-10-CM | POA: Diagnosis not present

## 2022-07-10 DIAGNOSIS — R300 Dysuria: Secondary | ICD-10-CM | POA: Diagnosis present

## 2022-07-10 DIAGNOSIS — M549 Dorsalgia, unspecified: Secondary | ICD-10-CM | POA: Insufficient documentation

## 2022-07-10 LAB — URINALYSIS, ROUTINE W REFLEX MICROSCOPIC
Bilirubin Urine: NEGATIVE
Glucose, UA: NEGATIVE mg/dL
Hgb urine dipstick: NEGATIVE
Ketones, ur: NEGATIVE mg/dL
Leukocytes,Ua: NEGATIVE
Nitrite: NEGATIVE
Protein, ur: NEGATIVE mg/dL
Specific Gravity, Urine: 1.017 (ref 1.005–1.030)
pH: 5.5 (ref 5.0–8.0)

## 2022-07-10 NOTE — ED Provider Notes (Signed)
[facility] Provider Note   CSN: [id number] Arrival date & time: 07/10/22  1019     History  Chief Complaint  Patient presents with   Dysuria    Aleksandra Stith is a 72 y.o. female.  HPI   72 year old female presenting to the emergency room with roughly 3 weeks of burning while urinating.  The patient states that her family nurse thought that she might have a UTI because "when I have a UTI I generally raging and very irritable."  She endorses irritability recently as well as some burning while urinating.  She denies any fever or chills.  She endorses generalized back pain, no abdominal pain, nausea, vomiting.  She has had loose stools for the past 2 days.  Home Medications Prior to Admission medications   Medication Sig Start Date End Date Taking? Authorizing Provider  amLODipine (NORVASC) 10 MG tablet Take 1 tablet (10 mg total) by mouth daily. 04/17/22 07/16/22  Custovic, Collene Mares, DO  aspirin EC 81 MG tablet Take 81 mg by mouth daily.    [provider]  Calcium Carb-Cholecalciferol (CALCIUM 600 + D PO) Take 1 tablet by mouth 2 (two) times daily.    [provider]  carvedilol (COREG) 6.25 MG tablet Take 6.25 mg by mouth 2 (two) times daily with a meal.    [provider]  CINNAMON PO Take 1 tablet by mouth daily.    [provider]  citalopram (CELEXA) 20 MG tablet Take 20 mg by mouth daily. Patient not taking: Reported on 04/17/2022    [provider]  diclofenac (VOLTAREN) 75 MG EC tablet Take 75 mg by mouth 2 (two) times daily.    [provider]  famotidine (PEPCID) 20 MG tablet Take 20 mg by mouth 2 (two) times daily.    [provider]  glipiZIDE (GLUCOTROL) 10 MG tablet Take 10 mg by mouth 2 (two) times daily before a meal.     [provider]  Glucose Blood (BLOOD GLUCOSE TEST STRIPS) STRP daily.     [provider]  levothyroxine (SYNTHROID,  LEVOTHROID) 100 MCG tablet Take 100 mcg by mouth daily.     [provider]  loperamide (IMODIUM A-D) 2 MG tablet Take 4 mg by mouth See admin instructions. Take 2 tablets (4 mg) by mouth at onset of diarrhea/loose stools, may take a 2nd dose of 1 tablet (2 mg) if still needed    [provider]  losartan (COZAAR) 100 MG tablet Take 100 mg by mouth daily.    [provider]  lovastatin (MEVACOR) 40 MG tablet Take 40 mg by mouth at bedtime.  02/20/17   [provider]  metFORMIN (GLUCOPHAGE) 1000 MG tablet Take 1,000 mg by mouth 2 (two) times daily with a meal.    [provider]  pioglitazone (ACTOS) 30 MG tablet Take 30 mg by mouth daily.  11/13/18   [provider]  psyllium (REGULOID) 0.52 g capsule Take 0.52 g by mouth daily.    [provider]      Allergies    Iodine, Lansoprazole, Naproxen, Sulfamethoxazole-trimethoprim, Ace inhibitors, Erythromycin, Ampicillin, and Penicillins    Review of Systems   Review of Systems  All other systems reviewed and are negative.   Physical Exam Updated Vital Signs BP 133/69 (BP Location: Right Arm)   Pulse 67   Temp 98.1 F (36.7 C) (Oral)   Resp 16   Ht 5' 6"$  (1.676 m)  Wt 72.1 kg   SpO2 100%   BMI 25.66 kg/m  Physical Exam Vitals and nursing note reviewed.  Constitutional:      General: She is not in acute distress.    Appearance: She is well-developed.  HENT:     Head: Normocephalic and atraumatic.  Eyes:     Conjunctiva/sclera: Conjunctivae normal.  Cardiovascular:     Rate and Rhythm: Normal rate and regular rhythm.  Pulmonary:     Effort: Pulmonary effort is normal. No respiratory distress.     Breath sounds: Normal breath sounds.  Abdominal:     Palpations: Abdomen is soft.     Tenderness: There is no abdominal tenderness.  Musculoskeletal:        General: No swelling.     Cervical back: Neck supple.  Skin:    General: Skin is warm and dry.     Capillary  Refill: Capillary refill takes less than 2 seconds.  Neurological:     Mental Status: She is alert. Mental status is at baseline.  Psychiatric:        Attention and Perception: Attention and perception normal.        Mood and Affect: Mood normal.        Speech: Speech normal.        Behavior: Behavior normal. Behavior is cooperative.        Thought Content: Thought content normal.     ED Results / Procedures / Treatments   Labs (all labs ordered are listed, but only abnormal results are displayed) Labs Reviewed  URINALYSIS, ROUTINE W REFLEX MICROSCOPIC    EKG None  Radiology No results found.  Procedures Procedures    Medications Ordered in ED Medications - No data to display  ED Course/ Medical Decision Making/ A&P                             Medical Decision Making Amount and/or Complexity of Data Reviewed Labs: ordered.    72 year old female presenting to the emergency room with roughly 3 weeks of burning while urinating.  The patient states that her family nurse thought that she might have a UTI because "when I have a UTI I generally raging and very irritable."  She endorses irritability recently as well as some burning while urinating.  She denies any fever or chills.  She endorses generalized back pain, no abdominal pain, nausea, vomiting.  She has had loose stools for the past 2 days.  On arrival, the patient was vitally stable, physical exam generally unremarkable.  Presenting with 3 weeks of dysuria.  Some irritability, reassuring psychiatric exam with normal thought content and behavior, not disoriented.  Urinalysis performed and negative for urinary tract infection.  Based on her physical exam, low concern for other acute intra-abdominal abnormality.   The patient has been appropriately medically screened and/or stabilized in the ED. I have low suspicion for any other emergent medical condition which would require further screening, evaluation or treatment in  the ED or require inpatient management.  Advised that the patient follow-up with her PCP regarding her symptoms.   Final Clinical Impression(s) / ED Diagnoses Final diagnoses:  Dysuria    Rx / DC Orders ED Discharge Orders     None         Regan Lemming, MD 07/10/22 1117

## 2022-07-10 NOTE — ED Triage Notes (Signed)
Pt arrives to ED with c/o dysuria and cloudy urine.

## 2022-07-10 NOTE — Discharge Instructions (Signed)
Urine was negative for urinary infection.  Recommend you follow-up with your PCP.

## 2022-08-21 ENCOUNTER — Other Ambulatory Visit: Payer: Medicare HMO

## 2022-08-22 ENCOUNTER — Other Ambulatory Visit: Payer: Medicare HMO

## 2022-08-24 ENCOUNTER — Other Ambulatory Visit: Payer: Medicare HMO

## 2022-08-28 ENCOUNTER — Ambulatory Visit: Payer: Medicare HMO | Admitting: Internal Medicine

## 2022-09-06 ENCOUNTER — Other Ambulatory Visit: Payer: Medicare HMO

## 2022-09-11 ENCOUNTER — Other Ambulatory Visit: Payer: Medicare HMO

## 2022-09-13 ENCOUNTER — Ambulatory Visit: Payer: Medicare HMO | Admitting: Internal Medicine

## 2023-09-18 ENCOUNTER — Emergency Department (HOSPITAL_COMMUNITY)
Admission: EM | Admit: 2023-09-18 | Discharge: 2023-09-18 | Disposition: A | Discharge status: Home / Self Care | Attending: Emergency Medicine | Admitting: Emergency Medicine

## 2023-09-18 ENCOUNTER — Encounter: Payer: Self-pay | Admitting: Gastroenterology

## 2023-09-18 ENCOUNTER — Other Ambulatory Visit: Payer: Self-pay

## 2023-09-18 ENCOUNTER — Encounter (HOSPITAL_COMMUNITY): Payer: Self-pay | Admitting: *Deleted

## 2023-09-18 ENCOUNTER — Emergency Department (HOSPITAL_COMMUNITY)

## 2023-09-18 DIAGNOSIS — Z7982 Long term (current) use of aspirin: Secondary | ICD-10-CM | POA: Insufficient documentation

## 2023-09-18 DIAGNOSIS — K5792 Diverticulitis of intestine, part unspecified, without perforation or abscess without bleeding: Secondary | ICD-10-CM | POA: Insufficient documentation

## 2023-09-18 DIAGNOSIS — R1030 Lower abdominal pain, unspecified: Secondary | ICD-10-CM | POA: Diagnosis present

## 2023-09-18 LAB — COMPREHENSIVE METABOLIC PANEL WITH GFR
ALT: 9 U/L (ref 0–44)
AST: 15 U/L (ref 15–41)
Albumin: 4.2 g/dL (ref 3.5–5.0)
Alkaline Phosphatase: 56 U/L (ref 38–126)
Anion gap: 10 (ref 5–15)
BUN: 9 mg/dL (ref 8–23)
CO2: 24 mmol/L (ref 22–32)
Calcium: 8.9 mg/dL (ref 8.9–10.3)
Chloride: 97 mmol/L — ABNORMAL LOW (ref 98–111)
Creatinine, Ser: 0.79 mg/dL (ref 0.44–1.00)
GFR, Estimated: >60 mL/min (ref >60)
Glucose, Bld: 108 mg/dL — ABNORMAL HIGH (ref 70–99)
Potassium: 3.6 mmol/L (ref 3.5–5.1)
Sodium: 131 mmol/L — ABNORMAL LOW (ref 135–145)
Total Bilirubin: 0.6 mg/dL (ref 0.0–1.2)
Total Protein: 7.6 g/dL (ref 6.5–8.1)

## 2023-09-18 LAB — URINALYSIS, ROUTINE W REFLEX MICROSCOPIC
Bilirubin Urine: NEGATIVE
Glucose, UA: NEGATIVE mg/dL
Hgb urine dipstick: NEGATIVE
Ketones, ur: NEGATIVE mg/dL
Leukocytes,Ua: NEGATIVE
Nitrite: NEGATIVE
Protein, ur: NEGATIVE mg/dL
Specific Gravity, Urine: 1.008 (ref 1.005–1.030)
pH: 5 (ref 5.0–8.0)

## 2023-09-18 LAB — CBG MONITORING, ED: Glucose-Capillary: 107 mg/dL — ABNORMAL HIGH (ref 70–99)

## 2023-09-18 LAB — CBC
HCT: 33.7 % — ABNORMAL LOW (ref 36.0–46.0)
Hemoglobin: 10.3 g/dL — ABNORMAL LOW (ref 12.0–15.0)
MCH: 26.5 pg (ref 26.0–34.0)
MCHC: 30.6 g/dL (ref 30.0–36.0)
MCV: 86.6 fL (ref 80.0–100.0)
Platelets: 269 10*3/uL (ref 150–400)
RBC: 3.89 MIL/uL (ref 3.87–5.11)
RDW: 14.4 % (ref 11.5–15.5)
WBC: 10.9 10*3/uL — ABNORMAL HIGH (ref 4.0–10.5)
nRBC: 0 % (ref 0.0–0.2)

## 2023-09-18 LAB — LIPASE, BLOOD: Lipase: 29 U/L (ref 11–51)

## 2023-09-18 MED ORDER — METRONIDAZOLE 500 MG PO TABS: 500.0000 mg | ORAL_TABLET | Freq: Two times a day (BID) | ORAL | 0 refills | Status: AC

## 2023-09-18 MED ORDER — CIPROFLOXACIN HCL 500 MG PO TABS
500.0000 mg | ORAL_TABLET | Freq: Once | ORAL | Status: AC
  Administered 2023-09-18: 500 mg via ORAL
  Filled 2023-09-18: qty 1

## 2023-09-18 MED ORDER — CIPROFLOXACIN HCL 500 MG PO TABS: 500.0000 mg | ORAL_TABLET | Freq: Two times a day (BID) | ORAL | 0 refills | Status: AC

## 2023-09-18 MED ORDER — IOHEXOL 300 MG/ML  SOLN
100.0000 mL | Freq: Once | INTRAMUSCULAR | Status: AC | PRN
  Administered 2023-09-18: 100 mL via INTRAVENOUS

## 2023-09-18 MED ORDER — DICYCLOMINE HCL 10 MG/ML IM SOLN
10.0000 mg | Freq: Once | INTRAMUSCULAR | Status: AC
  Administered 2023-09-18: 10 mg via INTRAMUSCULAR
  Filled 2023-09-18: qty 2

## 2023-09-18 MED ORDER — METRONIDAZOLE 500 MG PO TABS
500.0000 mg | ORAL_TABLET | Freq: Once | ORAL | Status: AC
  Administered 2023-09-18: 500 mg via ORAL
  Filled 2023-09-18: qty 1

## 2023-09-18 NOTE — ED Provider Triage Note (Signed)
 Emergency Medicine Provider Triage Evaluation Note  Anna Coleman , a 73 y.o. female  was evaluated in triage.  Pt complains of lower abdominal pain with loose stools that began last night.  Reports sharp stabbing pains in the lower abdomen ever since.  Has not been able to pass gas since.  History of bowel resection second to diverticulitis.  States that the pain feels similar to prior diverticulitis flares.  Review of Systems  Positive: Abdominal pain, loose stools Negative: Fevers, nausea, vomiting  Physical Exam  BP 115/64 (BP Location: Left Arm)   Pulse (!) 59   Temp 98.2 F (36.8 C) (Oral)   Resp 16   Ht 5\' 6"  (1.676 m)   Wt 74.8 kg   SpO2 100%   BMI 26.63 kg/m  Gen:   Awake, no distress   Resp:  Normal effort  MSK:   Moves extremities without difficulty  Other:    Medical Decision Making  Medically screening exam initiated at 8:35 AM.  Appropriate orders placed.  Anna Coleman was informed that the remainder of the evaluation will be completed by another provider, this initial triage assessment does not replace that evaluation, and the importance of remaining in the ED until their evaluation is complete.   Sonnie Dusky, PA-C 09/18/23 [phone]

## 2023-09-18 NOTE — ED Provider Notes (Signed)
 [facility] EMERGENCY DEPARTMENT AT [facility] Provider Note   CSN: [id number] Arrival date & time: 09/18/23  0141     History  Chief Complaint  Patient presents with   Abdominal Pain    Anna Coleman is a 73 y.o. female.  HPI 73 year old female presents with lower abdominal pain and diarrhea.  Symptoms started last night around midnight.  At its worst the pain was about a 10 but then has been mildly improving.  When she has a bowel movement the pain does improve a little bit.  She has had about 3 or 4 loose stools without blood.  Right now there is no pain at rest though she states she was given a shot that helped in triage (Bentyl ).  No vomiting or fever.  Has a history of prior diverticulitis but has not had any in several years since having a surgical procedure for it.  Home Medications Prior to Admission medications   Medication Sig Start Date End Date Taking? Authorizing Provider  ciprofloxacin  (CIPRO ) 500 MG tablet Take 1 tablet (500 mg total) by mouth 2 (two) times daily. One po bid x 7 days 09/18/23  Yes Jerilynn Montenegro, MD  metroNIDAZOLE  (FLAGYL ) 500 MG tablet Take 1 tablet (500 mg total) by mouth 2 (two) times daily. 09/18/23  Yes Jerilynn Montenegro, MD  amLODipine  (NORVASC ) 10 MG tablet Take 1 tablet (10 mg total) by mouth daily. 04/17/22 07/16/22  Custovic, Sabina, DO  aspirin  EC 81 MG tablet Take 81 mg by mouth daily.    [provider]  Calcium Carb-Cholecalciferol (CALCIUM 600 + D PO) Take 1 tablet by mouth 2 (two) times daily.    [provider]  carvedilol  (COREG ) 6.25 MG tablet Take 6.25 mg by mouth 2 (two) times daily with a meal.    [provider]  CINNAMON PO Take 1 tablet by mouth daily.    [provider]  citalopram  (CELEXA ) 20 MG tablet Take 20 mg by mouth daily. Patient not taking: Reported on 04/17/2022    [provider]  diclofenac (VOLTAREN) 75 MG EC tablet Take 75 mg by mouth 2 (two) times daily.     [provider]  famotidine (PEPCID) 20 MG tablet Take 20 mg by mouth 2 (two) times daily.    [provider]  glipiZIDE  (GLUCOTROL ) 10 MG tablet Take 10 mg by mouth 2 (two) times daily before a meal.     [provider]  Glucose Blood (BLOOD GLUCOSE TEST STRIPS) STRP daily.     [provider]  levothyroxine  (SYNTHROID , LEVOTHROID) 100 MCG tablet Take 100 mcg by mouth daily.     [provider]  loperamide (IMODIUM A-D) 2 MG tablet Take 4 mg by mouth See admin instructions. Take 2 tablets (4 mg) by mouth at onset of diarrhea/loose stools, may take a 2nd dose of 1 tablet (2 mg) if still needed    [provider]  losartan  (COZAAR ) 100 MG tablet Take 100 mg by mouth daily.    [provider]  lovastatin (MEVACOR) 40 MG tablet Take 40 mg by mouth at bedtime.  02/20/17   [provider]  metFORMIN  (GLUCOPHAGE ) 1000 MG tablet Take 1,000 mg by mouth 2 (two) times daily with a meal.    [provider]  pioglitazone (ACTOS) 30 MG tablet Take 30 mg by mouth daily.  11/13/18   [provider]  psyllium (REGULOID) 0.52 g capsule Take 0.52 g by mouth daily.  [provider]      Allergies    Iodine, Lansoprazole, Naproxen , Sulfamethoxazole-trimethoprim, Ace inhibitors, Erythromycin, Ampicillin, and Penicillins    Review of Systems   Review of Systems  Constitutional:  Negative for fever.  Gastrointestinal:  Positive for abdominal pain and diarrhea. Negative for vomiting.    Physical Exam Updated Vital Signs BP (!) 138/92 (BP Location: Right Arm)   Pulse (!) 56   Temp 98.2 F (36.8 C) (Oral)   Resp 16   Ht 5\' 6"  (1.676 m)   Wt 74.8 kg   SpO2 100%   BMI 26.63 kg/m  Physical Exam Vitals and nursing note reviewed.  Constitutional:      General: She is not in acute distress.    Appearance: She is well-developed. She is not ill-appearing or diaphoretic.  HENT:     Head: Normocephalic and  atraumatic.  Cardiovascular:     Rate and Rhythm: Normal rate and regular rhythm.     Heart sounds: Normal heart sounds.  Pulmonary:     Effort: Pulmonary effort is normal.  Abdominal:     Palpations: Abdomen is soft.     Tenderness: There is abdominal tenderness in the right lower quadrant, suprapubic area and left lower quadrant.  Skin:    General: Skin is warm and dry.  Neurological:     Mental Status: She is alert.     ED Results / Procedures / Treatments   Labs (all labs ordered are listed, but only abnormal results are displayed) Labs Reviewed  COMPREHENSIVE METABOLIC PANEL WITH GFR - Abnormal; Notable for the following components:      Result Value   Sodium 131 (*)    Chloride 97 (*)    Glucose, Bld 108 (*)    All other components within normal limits  CBC - Abnormal; Notable for the following components:   WBC 10.9 (*)    Hemoglobin 10.3 (*)    HCT 33.7 (*)    All other components within normal limits  CBG MONITORING, ED - Abnormal; Notable for the following components:   Glucose-Capillary 107 (*)    All other components within normal limits  LIPASE, BLOOD  URINALYSIS, ROUTINE W REFLEX MICROSCOPIC    EKG None  Radiology CT ABDOMEN PELVIS W CONTRAST Result Date: 09/18/2023 CLINICAL DATA:  Abdominal pain EXAM: CT ABDOMEN AND PELVIS WITH CONTRAST TECHNIQUE: Multidetector CT imaging of the abdomen and pelvis was performed using the standard protocol following bolus administration of intravenous contrast. RADIATION DOSE REDUCTION: This exam was performed according to the departmental dose-optimization program which includes automated exposure control, adjustment of the mA and/or kV according to patient size and/or use of iterative reconstruction technique. CONTRAST:  OMNIPAQUE  IOHEXOL  300 MG/ML  SOLN COMPARISON:  June 30, 2018 FINDINGS: Lower chest: No acute abnormality. Hepatobiliary: No focal liver abnormality is seen. Status post cholecystectomy. No biliary  dilatation. Pancreas: Unremarkable. No pancreatic ductal dilatation or surrounding inflammatory changes. Spleen: Normal in size without focal abnormality. Adrenals/Urinary Tract: Adrenal glands are unremarkable. Kidneys are normal, without renal calculi, focal lesion, or hydronephrosis. Bladder is unremarkable. Stomach/Bowel: Diffuse inflammatory changes involving the sigmoid colon with diffuse mucosal thickening and Peri colonic fat stranding findings consistent with acute diverticulitis stage 1-2 (series 4). No abscesses. Abundant residual fecal material right side colon and transverse colon without obstruction Vascular/Lymphatic: Aortic atherosclerosis. No enlarged abdominal or pelvic lymph nodes. Reproductive: Uterus and bilateral adnexa are unremarkable. Other: No abdominal wall hernia or abnormality. No abdominopelvic ascites. Musculoskeletal: No acute  or significant osseous findings. IMPRESSION: *Acute sigmoid diverticulitis stage 1-2. No abscesses. *Aortic atherosclerosis. Electronically Signed   By: Fredrich Jefferson M.D.   On: 09/18/2023 12:04    Procedures Procedures    Medications Ordered in ED Medications  ciprofloxacin  (CIPRO ) tablet 500 mg (has no administration in time range)  metroNIDAZOLE  (FLAGYL ) tablet 500 mg (has no administration in time range)  dicyclomine  (BENTYL ) injection 10 mg (10 mg Intramuscular Given 09/18/23 0909)  iohexol  (OMNIPAQUE ) 300 MG/ML solution 100 mL (100 mLs Intravenous Contrast Given 09/18/23 1029)    ED Course/ Medical Decision Making/ A&P                                 Medical Decision Making Amount and/or Complexity of Data Reviewed Labs: ordered.    Details: Mild leukocytosis Radiology: independent interpretation performed.    Details: Diverticulitis  Risk Prescription drug management.   CT confirms diverticulitis.  No complications noted.  Pain seems to be well-controlled here.  She was given Bentyl  in triage prior to me seeing her.  Otherwise,  she feels comfortable going home with outpatient management.  She has seen [facility] for a colonoscopy 5 years ago, will refer back to them.  Will also recommend follow-up with PCP.  She will be given Cipro  and Flagyl  as she has a penicillin allergy.  I advised her to hold her glipizide  and discussed with her doctor given glipizide  and Cipro  can interact.  Otherwise, will give return precautions but she appears stable for outpatient management with oral antibiotics.        Final Clinical Impression(s) / ED Diagnoses Final diagnoses:  Acute diverticulitis    Rx / DC Orders ED Discharge Orders          Ordered    ciprofloxacin  (CIPRO ) 500 MG tablet  2 times daily        09/18/23 1223    metroNIDAZOLE  (FLAGYL ) 500 MG tablet  2 times daily        09/18/23 1223              Jerilynn Montenegro, MD 09/18/23 1237

## 2023-09-18 NOTE — ED Triage Notes (Signed)
 Pt reporting pain just below the navel "sharp like a knife" that started around midnight. She denies n/v/d. LBM just around MN, just prior to onset of pain. Finished med for UTI last week.

## 2023-09-18 NOTE — ED Notes (Signed)
 Patient transported to CT

## 2023-09-18 NOTE — Discharge Instructions (Signed)
 If you develop worsening, continued, or recurrent abdominal pain, uncontrolled vomiting, fever, chest or back pain, or any other new/concerning symptoms then return to the ER for evaluation.

## 2023-11-14 ENCOUNTER — Encounter: Payer: Self-pay | Admitting: Gastroenterology

## 2023-11-14 ENCOUNTER — Ambulatory Visit: Admitting: Gastroenterology

## 2023-11-14 VITALS — BP 126/64 | HR 56 | Ht 65.0 in | Wt 167.2 lb

## 2023-11-14 DIAGNOSIS — K5732 Diverticulitis of large intestine without perforation or abscess without bleeding: Secondary | ICD-10-CM | POA: Diagnosis not present

## 2023-11-14 DIAGNOSIS — Z860101 Personal history of adenomatous and serrated colon polyps: Secondary | ICD-10-CM | POA: Diagnosis not present

## 2023-11-14 DIAGNOSIS — Z860100 Personal history of colon polyps, unspecified: Secondary | ICD-10-CM | POA: Insufficient documentation

## 2023-11-14 MED ORDER — NA SULFATE-K SULFATE-MG SULF 17.5-3.13-1.6 GM/177ML PO SOLN: 1.0000 | Freq: Once | ORAL | 0 refills | Status: AC

## 2023-11-14 NOTE — Progress Notes (Signed)
 11/14/2023 Anna Coleman [id number] June 11, 1950   HISTORY OF PRESENT ILLNESS: This is a 73 year old female is a patient of Dr. Allean Aran.  She is here today to schedule her colonoscopy.  As below colonoscopy in February 2020 showed a large 18 mm tubular adenoma that was removed.  Repeat colonoscopy in September 2020 showed just a small tubular adenoma.  Repeat was recommended in 3 years.  Unfortunately she ended up needing to spend about a year and a half in [place]  where she is originally from and never got here for her follow-up procedure.  She is here to schedule now.  She did have an episode of diverticulitis back in April.  CT scan with contrast at that time showed acute sigmoid diverticulitis with no abscesses.  She was treated with a course of Cipro  and Flagyl  and symptoms completely resolved.  She has no residual pain, etc.  Colonoscopy 01/2019:  - One 3 mm polyp in the cecum, removed with a cold snare. Resected and retrieved. - A single colonic angioectasia. - Diverticulosis in the sigmoid colon, in the descending colon and in the ascending colon. - Non- bleeding internal hemorrhoids.  Surgical [P], colon, cecum, polyp - TUBULAR ADENOMA - NEGATIVE FOR HIGH-GRADE DYSPLASIA OR MALIGNANCY   Colonoscopy 06/2018: - One 5 mm polyp in the ascending colon, removed with a cold snare. Resected and retrieved. - One 18 mm polyp in the mid ascending colon, removed piecemeal using a cold snare. Resected and retrieved. Tattooed. - One 2 mm polyp in the transverse colon, removed with a cold biopsy forceps. Resected and retrieved. - Moderate diverticulosis in the sigmoid colon and in the descending colon. - Non- bleeding internal hemorrhoids.  Surgical [P], ascending colon and transverse, polyp (3) - MULTIPLE FRAGMENTS OF TUBULAR ADENOMA(S) - NO HIGH GRADE DYSPLASIA OR MALIGNANCY IDENTIFIED   Past Medical History:  Diagnosis Date   Allergy    Arthritis    left shoulder; knees (06/17/2015)    Cataract    Depression    Diabetes (HCC)    Diverticulitis    Hashimoto's disease    Hyperlipidemia    Hypertension    Hypothyroidism    Kidney stones    Pneumonia 1960s X 1   Thyroid disease    Type II diabetes mellitus (HCC)    Urinary tract infection    Past Surgical History:  Procedure Laterality Date   CARPAL TUNNEL RELEASE Right    CESAREAN SECTION  1977; 1979   CHOLECYSTECTOMY OPEN  1980s   COLON SURGERY  ~ 2010   took 10i nches of my bowel out for diverticulitis   COLONOSCOPY  07/05/2018   COLOSTOMY  ~ 2010   COLOSTOMY CLOSURE  ~ 2010   closed it 6 weeks after they put it in   CYST EXCISION  [phone]   couple on my head; one on my butt, left forearm   diverticulitis     GYNECOLOGIC CRYOSURGERY  ~ 1983   froze my cervix   POLYPECTOMY     TUBAL LIGATION  1979    reports that she has never smoked. She has never used smokeless tobacco. She reports that she does not drink alcohol and does not use drugs. family history includes Diabetes in her brother and mother; Heart failure in her mother; Parkinson's disease in her father; Stroke in her brother. Allergies  Allergen Reactions   Iodine Other (See Comments)   Lansoprazole Other (See Comments)   Naproxen  Other (See Comments)   Sulfamethoxazole-Trimethoprim  Rash   Ace Inhibitors Cough   Erythromycin Nausea And Vomiting   Ampicillin Rash   Penicillins Rash    Did it involve swelling of the face/tongue/throat, SOB, or low BP? No Did it involve sudden or severe rash/hives, skin peeling, or any reaction on the inside of your mouth or nose? Yes Did you need to seek medical attention at a hospital or doctor's office? Yes When did it last happen?     1974 If all above answers are "NO", may proceed with cephalosporin use.      Outpatient Encounter Medications as of 11/14/2023  Medication Sig   amLODipine  (NORVASC ) 10 MG tablet Take 1 tablet (10 mg total) by mouth daily.   aspirin  EC 81 MG tablet Take 81 mg by  mouth daily.   Calcium Carb-Cholecalciferol (CALCIUM 600 + D PO) Take 1 tablet by mouth 2 (two) times daily.   carvedilol  (COREG ) 6.25 MG tablet Take 6.25 mg by mouth 2 (two) times daily with a meal.   citalopram  (CELEXA ) 20 MG tablet Take 20 mg by mouth daily.   famotidine (PEPCID) 20 MG tablet Take 20 mg by mouth 2 (two) times daily.   glipiZIDE  (GLUCOTROL ) 10 MG tablet Take 10 mg by mouth 2 (two) times daily before a meal.    Glucose Blood (BLOOD GLUCOSE TEST STRIPS) STRP daily.    levothyroxine  (SYNTHROID , LEVOTHROID) 100 MCG tablet Take 100 mcg by mouth daily.    losartan  (COZAAR ) 100 MG tablet Take 100 mg by mouth daily.   lovastatin (MEVACOR) 40 MG tablet Take 40 mg by mouth at bedtime.    metFORMIN  (GLUCOPHAGE ) 1000 MG tablet Take 1,000 mg by mouth 2 (two) times daily with a meal.   pioglitazone (ACTOS) 30 MG tablet Take 30 mg by mouth daily.    CINNAMON PO Take 1 tablet by mouth daily. (Patient not taking: Reported on 11/14/2023)   ciprofloxacin  (CIPRO ) 500 MG tablet Take 1 tablet (500 mg total) by mouth 2 (two) times daily. One po bid x 7 days (Patient not taking: Reported on 11/14/2023)   diclofenac (VOLTAREN) 75 MG EC tablet Take 75 mg by mouth 2 (two) times daily. (Patient not taking: Reported on 11/14/2023)   loperamide (IMODIUM A-D) 2 MG tablet Take 4 mg by mouth See admin instructions. Take 2 tablets (4 mg) by mouth at onset of diarrhea/loose stools, may take a 2nd dose of 1 tablet (2 mg) if still needed (Patient not taking: Reported on 11/14/2023)   metroNIDAZOLE  (FLAGYL ) 500 MG tablet Take 1 tablet (500 mg total) by mouth 2 (two) times daily. (Patient not taking: Reported on 11/14/2023)   psyllium (REGULOID) 0.52 g capsule Take 0.52 g by mouth daily. (Patient not taking: Reported on 11/14/2023)   No facility-administered encounter medications on file as of 11/14/2023.    REVIEW OF SYSTEMS  : All other systems reviewed and negative except where noted in the History of Present  Illness.   PHYSICAL EXAM: BP 126/64 (BP Location: Right Arm, Patient Position: Sitting, Cuff Size: Normal)   Pulse (!) 56   Ht 5' 5 (1.651 m) Comment: Height measured without shoes  Wt 167 lb 4 oz (75.9 kg)   BMI 27.83 kg/m  General: Well developed white female in no acute distress Head: Normocephalic and atraumatic Eyes:  Sclerae anicteric, conjunctiva pink. Ears: Normal auditory acuity Lungs: Clear throughout to auscultation; no W/R/R. Heart: Regular rate and rhythm; no M/R/G. Abdomen: Soft, non-distended.  BS present.  Non-tender. Rectal:  Will be  done at the time of colonoscopy. Musculoskeletal: Symmetrical with no gross deformities  Skin: No lesions on visible extremities Extremities: No edema  Neurological: Alert oriented x 4, grossly non-focal Psychological:  Alert and cooperative. Normal mood and affect  ASSESSMENT AND PLAN: *Personal history of colon polyps: Had an 18 mm tubular adenoma removed in February 2020.  Then had a repeat colonoscopy in September 2020 with just a small tubular adenoma removed.  Repeat was recommended in 3 years.  Unfortunately she ended up spending over a year and a half in [place]  where she is originally from so never got her up for her procedure.  Will schedule with Dr. Nandigam.  The risks, benefits, and alternatives to colonoscopy were discussed with the patient and she consents to proceed.  *Diverticulitis: Episode of uncomplicated sigmoid diverticulitis in April seen on CT scan.  Treated with Cipro  and Flagyl .  Symptoms resolved with no residual pain, etc.     CC:  McClanahan, Kyra, NP

## 2023-11-14 NOTE — Patient Instructions (Signed)
 You have been scheduled for a colonoscopy. Please follow written instructions given to you at your visit today.   If you use inhalers (even only as needed), please bring them with you on the day of your procedure.  DO NOT TAKE 7 DAYS PRIOR TO TEST- Trulicity (dulaglutide) Ozempic, Wegovy (semaglutide) Mounjaro (tirzepatide) Bydureon Bcise (exanatide extended release)  DO NOT TAKE 1 DAY PRIOR TO YOUR TEST Rybelsus (semaglutide) Adlyxin (lixisenatide) Victoza (liraglutide) Byetta (exanatide) ____________________________________________________________________  _______________________________________________________  If your blood pressure at your visit was 140/90 or greater, please contact your primary care physician to follow up on this.  _______________________________________________________  If you are age 90 or older, your body mass index should be between 23-30. Your Body mass index is 27.83 kg/m. If this is out of the aforementioned range listed, please consider follow up with your Primary Care Provider.  If you are age 33 or younger, your body mass index should be between 19-25. Your Body mass index is 27.83 kg/m. If this is out of the aformentioned range listed, please consider follow up with your Primary Care Provider.   ________________________________________________________  The  GI providers would like to encourage you to use MYCHART to communicate with providers for non-urgent requests or questions.  Due to long hold times on the telephone, sending your provider a message by [facility] may be a faster and more efficient way to get a response.  Please allow 48 business hours for a response.  Please remember that this is for non-urgent requests.  _______________________________________________________

## 2023-12-17 ENCOUNTER — Encounter: Payer: Self-pay | Admitting: Gastroenterology

## 2023-12-19 ENCOUNTER — Telehealth: Payer: Self-pay | Admitting: Gastroenterology

## 2023-12-19 NOTE — Telephone Encounter (Signed)
 Good morning Dr. Nandigam, I received a call from this patient stating that she has cold and flu like symptoms and would like to reschedule her procedure appointment. Patient was originally scheduled for July the 28 th and was reschedule for August the 28 th. Please advise.

## 2023-12-24 ENCOUNTER — Encounter: Admitting: Gastroenterology

## 2024-01-24 ENCOUNTER — Encounter: Admitting: Gastroenterology

## 2024-02-14 ENCOUNTER — Encounter: Admitting: Gastroenterology

## 2024-02-19 ENCOUNTER — Encounter: Admitting: Gastroenterology

## 2024-02-19 ENCOUNTER — Telehealth: Payer: Self-pay | Admitting: Gastroenterology

## 2024-02-19 NOTE — Telephone Encounter (Signed)
 Please send note to PCP given patient has no-showed multiple times, we will not be rescheduling her and will have to discharge patient

## 2024-02-19 NOTE — Telephone Encounter (Signed)
 Hi Dr Shila,   I called patient regarding her procedure apt today, she stated she won't be coming in because she thought her apt was on 10/23. This will be her 4 times cancelling procedure. I will no show her.   Thank you
